# Patient Record
Sex: Female | Born: 1970 | Race: White | Hispanic: No | Smoking: Never smoker
Health system: Southern US, Community
[De-identification: ages and names within clinical notes are randomized; demographics above are authoritative.]

## PROBLEM LIST (undated history)

## (undated) DIAGNOSIS — F988 Other specified behavioral and emotional disorders with onset usually occurring in childhood and adolescence: Secondary | ICD-10-CM

## (undated) HISTORY — DX: Other specified behavioral and emotional disorders with onset usually occurring in childhood and adolescence: F98.8

## (undated) HISTORY — PX: WISDOM TOOTH EXTRACTION: SHX21

---

## 2011-07-24 ENCOUNTER — Ambulatory Visit (INDEPENDENT_AMBULATORY_CARE_PROVIDER_SITE_OTHER): Payer: BC Managed Care – PPO

## 2011-07-24 ENCOUNTER — Inpatient Hospital Stay (INDEPENDENT_AMBULATORY_CARE_PROVIDER_SITE_OTHER)
Admission: RE | Admit: 2011-07-24 | Discharge: 2011-07-24 | Disposition: A | Discharge status: Home / Self Care | Payer: BC Managed Care – PPO | Source: Ambulatory Visit | Attending: Emergency Medicine | Admitting: Emergency Medicine

## 2011-07-24 DIAGNOSIS — S0120XA Unspecified open wound of nose, initial encounter: Secondary | ICD-10-CM

## 2014-12-23 ENCOUNTER — Encounter (HOSPITAL_COMMUNITY): Payer: Self-pay | Admitting: *Deleted

## 2014-12-23 ENCOUNTER — Emergency Department (HOSPITAL_COMMUNITY)
Admission: EM | Admit: 2014-12-23 | Discharge: 2014-12-23 | Disposition: A | Discharge status: Home / Self Care | Payer: BC Managed Care – PPO | Source: Home / Self Care | Attending: Family Medicine | Admitting: Family Medicine

## 2014-12-23 DIAGNOSIS — R0789 Other chest pain: Secondary | ICD-10-CM

## 2014-12-23 NOTE — Discharge Instructions (Signed)
Chest Wall Pain Chest wall pain is pain in or around the bones and muscles of your chest. It may take up to 6 weeks to get better. It may take longer if you must stay physically active in your work and activities.  CAUSES  Chest wall pain may happen on its own. However, it may be caused by:  A viral illness like the flu.  Injury.  Coughing.  Exercise.  Arthritis.  Fibromyalgia.  Shingles. HOME CARE INSTRUCTIONS   Avoid overtiring physical activity. Try not to strain or perform activities that cause pain. This includes any activities using your chest or your abdominal and side muscles, especially if heavy weights are used.  Put ice on the sore area.  Put ice in a plastic bag.  Place a towel between your skin and the bag.  Leave the ice on for 15-20 minutes per hour while awake for the first 2 days.  Only take over-the-counter or prescription medicines for pain, discomfort, or fever as directed by your caregiver. SEEK IMMEDIATE MEDICAL CARE IF:   Your pain increases, or you are very uncomfortable.  You have a fever.  Your chest pain becomes worse.  You have new, unexplained symptoms.  You have nausea or vomiting.  You feel sweaty or lightheaded.  You have a cough with phlegm (sputum), or you cough up blood. MAKE SURE YOU:   Understand these instructions.  Will watch your condition.  Will get help right away if you are not doing well or get worse. Document Released: 10/18/2005 Document Revised: 01/10/2012 Document Reviewed: 06/14/2011 [facility] Patient Information 2015 [place], [place]. This information is not intended to replace advice given to you by your health care provider. Make sure you discuss any questions you have with your health care provider.  Musculoskeletal Pain Musculoskeletal pain is muscle and boney aches and pains. These pains can occur in any part of the body. Your caregiver may treat you without knowing the cause of the pain. They may treat you  if blood or urine tests, X-rays, and other tests were normal.  CAUSES There is often not a definite cause or reason for these pains. These pains may be caused by a type of germ (virus). The discomfort may also come from overuse. Overuse includes working out too hard when your body is not fit. Boney aches also come from weather changes. Bone is sensitive to atmospheric pressure changes. HOME CARE INSTRUCTIONS   Ask when your test results will be ready. Make sure you get your test results.  Only take over-the-counter or prescription medicines for pain, discomfort, or fever as directed by your caregiver. If you were given medications for your condition, do not drive, operate machinery or power tools, or sign legal documents for 24 hours. Do not drink alcohol. Do not take sleeping pills or other medications that may interfere with treatment.  Continue all activities unless the activities cause more pain. When the pain lessens, slowly resume normal activities. Gradually increase the intensity and duration of the activities or exercise.  During periods of severe pain, bed rest may be helpful. Lay or sit in any position that is comfortable.  Putting ice on the injured area.  Put ice in a bag.  Place a towel between your skin and the bag.  Leave the ice on for 15 to 20 minutes, 3 to 4 times a day.  Follow up with your caregiver for continued problems and no reason can be found for the pain. If the pain becomes worse  or does not go away, it may be necessary to repeat tests or do additional testing. Your caregiver may need to look further for a possible cause. SEEK IMMEDIATE MEDICAL CARE IF:  You have pain that is getting worse and is not relieved by medications.  You develop chest pain that is associated with shortness or breath, sweating, feeling sick to your stomach (nauseous), or throw up (vomit).  Your pain becomes localized to the abdomen.  You develop any new symptoms that seem different  or that concern you. MAKE SURE YOU:   Understand these instructions.  Will watch your condition.  Will get help right away if you are not doing well or get worse. Document Released: 10/18/2005 Document Revised: 01/10/2012 Document Reviewed: 06/22/2013 [facility] Patient Information 2015 [place], [place]. This information is not intended to replace advice given to you by your health care provider. Make sure you discuss any questions you have with your health care provider.  Chest Pain Observation It is often hard to give a specific diagnosis for the cause of chest pain. Among other possibilities your symptoms might be caused by inadequate oxygen delivery to your heart (angina). Angina that is not treated or evaluated can lead to a heart attack (myocardial infarction) or death. Blood tests, electrocardiograms, and X-rays may have been done to help determine a possible cause of your chest pain. After evaluation and observation, your health care provider has determined that it is unlikely your pain was caused by an unstable condition that requires hospitalization. However, a full evaluation of your pain may need to be completed, with additional diagnostic testing as directed. It is very important to keep your follow-up appointments. Not keeping your follow-up appointments could result in permanent heart damage, disability, or death. If there is any problem keeping your follow-up appointments, you must call your health care provider. HOME CARE INSTRUCTIONS  Due to the slight chance that your pain could be angina, it is important to follow your health care provider's treatment plan and also maintain a healthy lifestyle:  Maintain or work toward achieving a healthy weight.  Stay physically active and exercise regularly.  Decrease your salt intake.  Eat a balanced, healthy diet. Talk to a dietitian to learn about heart-healthy foods.  Increase your fiber intake by including whole grains, vegetables,  fruits, and nuts in your diet.  Avoid situations that cause stress, anger, or depression.  Take medicines as advised by your health care provider. Report any side effects to your health care provider. Do not stop medicines or adjust the dosages on your own.  Quit smoking. Do not use nicotine patches or gum until you check with your health care provider.  Keep your blood pressure, blood sugar, and cholesterol levels within normal limits.  Limit alcohol intake to no more than 1 drink per day for women who are not pregnant and 2 drinks per day for men.  Do not abuse drugs. SEEK IMMEDIATE MEDICAL CARE IF: You have severe chest pain or pressure which may include symptoms such as:  You feel pain or pressure in your arms, neck, jaw, or back.  You have severe back or abdominal pain, feel sick to your stomach (nauseous), or throw up (vomit).  You are sweating profusely.  You are having a fast or irregular heartbeat.  You feel short of breath while at rest.  You notice increasing shortness of breath during rest, sleep, or with activity.  You have chest pain that does not get better after rest or after  taking your usual medicine.  You wake from sleep with chest pain.  You are unable to sleep because you cannot breathe.  You develop a frequent cough or you are coughing up blood.  You feel dizzy, faint, or experience extreme fatigue.  You develop severe weakness, dizziness, fainting, or chills. Any of these symptoms may represent a serious problem that is an emergency. Do not wait to see if the symptoms will go away. Call your local emergency services (911 in the [place] Do not drive yourself to the hospital. MAKE SURE YOU:  Understand these instructions.  Will watch your condition.  Will get help right away if you are not doing well or get worse. Document Released: 11/20/2010 Document Revised: 10/23/2013 Document Reviewed: 04/19/2013 [facility] Patient Information 2015 [place],  [place]. This information is not intended to replace advice given to you by your health care provider. Make sure you discuss any questions you have with your health care provider.

## 2014-12-23 NOTE — ED Provider Notes (Signed)
CSN: [id number]     Arrival date & time 12/23/14  1337 History   First MD Initiated Contact with Patient 12/23/14 1411     Chief Complaint  Patient presents with  . 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 This pain was exacerbated by deep breathing and arm movements. She developed anxiety and hyperventilation associated with this pain. She had her vital signs checked at the school and she remembers that they were normal. She lied down for about an hour and the pain did not abate. She came to the urgent care department with a high level of anxiety and crying with the chest pain. Interestingly she is in excellent physical condition. She exercises regularly by running and lifting weights. She is thin considers himself to be in generally very good health. She has no medical history and takes no medications. No recent history of prolonged sitting or sedentary periods of time. After she had her EKG and had some time to calm down the patient stated that her pain had completely abated. She has no symptoms. She states "I feel great". She had physically worked out this morning and had no chest pain or other symptoms.   History reviewed. No pertinent past medical history. History reviewed. No pertinent past surgical history. No family history on file. History  Substance Use Topics  . Smoking status: Never Smoker   . Smokeless tobacco: Not on file  . Alcohol Use: Yes     Comment: occasional   OB History    No data available     Review of Systems  Constitutional: Negative for fever, activity change, appetite change and fatigue.  HENT: Negative.   Respiratory: Negative for cough.        As per history of present illness.   Cardiovascular:  Positive for chest pain. Negative for palpitations and leg swelling.  Gastrointestinal: Negative.   Genitourinary: Negative.   Musculoskeletal: Negative for neck pain and neck stiffness.  Skin: Negative.   Neurological: Negative.  Negative for tremors, seizures, syncope, speech difficulty, numbness and headaches.  Psychiatric/Behavioral: The patient is nervous/anxious.     Allergies  Review of patient's allergies indicates no known allergies.  Home Medications   Prior to Admission medications   Not on File   BP 137/80 mmHg  Pulse 67  Temp(Src) 98.3 F (36.8 C) (Oral)  Resp 16  SpO2 100%  LMP 11/20/2014 Physical Exam  Constitutional: She is oriented to person, place, and time. She appears well-developed and well-nourished. No distress.  HENT:  Head: Normocephalic and atraumatic.  Eyes: Conjunctivae and EOM are normal. Pupils are equal, round, and reactive to light.  Neck: Normal range of motion. Neck supple.  Cardiovascular: Normal rate, regular rhythm, normal heart sounds and intact distal pulses.  Exam reveals no gallop and no friction rub.   No murmur heard. Pulmonary/Chest: Effort normal and breath sounds normal. No respiratory distress. She has no wheezes. She has no rales. She exhibits no tenderness.  Unable to reproduce the chest pain with manual pressure. At the patient perform various arm movements and isometric exercises using arm and anterior chest muscles. None of these movements reproduce the pain.  Musculoskeletal: Normal range of motion. She exhibits no edema or tenderness.  Lymphadenopathy:    She has no cervical adenopathy.  Neurological: She  is alert and oriented to person, place, and time. She exhibits normal muscle tone.  Skin: Skin is warm and dry. She is not diaphoretic.  Psychiatric: She has a normal mood and affect.  Nursing note and vitals reviewed.   ED Course  Procedures (including critical care time) Labs Review Labs Reviewed - No data to  display  Imaging Review No results found. EKG: Normal sinus rhythm. No ectopy. Normal axis. Heart rate 57. No ST-T changes.  MDM   1. Left-sided chest wall pain    Ice locally Ease back into upper body workouts. Go to the ED for chest pain associated with heaviness, tightness, pressure, squeezing, shortness of breath, diaphoresis, weakness or other symptoms. Reassurance. The symptomatology is that of acute musculoskeletal chest wall pain.   Janne Napoleon, NP 12/23/14 1438

## 2014-12-23 NOTE — ED Notes (Signed)
Sudden onset today at 1230 sharp pain above left breast with difficulty taking a deep breath.  Pt did her usual  Exercise workout this morning.  She denies nausea or diaphoresis or indigestion.  Skin warm/dry color flushed in the face only

## 2016-09-09 ENCOUNTER — Ambulatory Visit (INDEPENDENT_AMBULATORY_CARE_PROVIDER_SITE_OTHER): Payer: BC Managed Care – PPO | Admitting: Neurology

## 2016-09-09 ENCOUNTER — Encounter: Payer: Self-pay | Admitting: Neurology

## 2016-09-09 ENCOUNTER — Other Ambulatory Visit (INDEPENDENT_AMBULATORY_CARE_PROVIDER_SITE_OTHER): Payer: BC Managed Care – PPO

## 2016-09-09 ENCOUNTER — Telehealth: Payer: Self-pay

## 2016-09-09 VITALS — BP 126/68 | HR 64 | Ht 65.5 in | Wt 141.0 lb

## 2016-09-09 DIAGNOSIS — R51 Headache: Secondary | ICD-10-CM | POA: Diagnosis not present

## 2016-09-09 DIAGNOSIS — R413 Other amnesia: Secondary | ICD-10-CM

## 2016-09-09 DIAGNOSIS — R519 Headache, unspecified: Secondary | ICD-10-CM

## 2016-09-09 LAB — VITAMIN B12: VITAMIN B 12: 781 pg/mL (ref 211–911)

## 2016-09-09 MED ORDER — TIZANIDINE HCL 2 MG PO TABS: ORAL_TABLET | ORAL | 0 refills | Status: DC

## 2016-09-09 MED ORDER — NORTRIPTYLINE HCL 25 MG PO CAPS: 25.0000 mg | ORAL_CAPSULE | Freq: Every day | ORAL | 3 refills | Status: DC

## 2016-09-09 NOTE — Telephone Encounter (Signed)
-----   Message from Pieter Partridge, DO sent at 09/09/2016 12:46 PM EST ----- b12 is normal

## 2016-09-09 NOTE — Progress Notes (Signed)
Chart forwarded.  

## 2016-09-09 NOTE — Progress Notes (Signed)
NEUROLOGY CONSULTATION NOTE  Terri Clarke MRN: [id number] DOB: 07-Dec-1970  Referring provider: Dr. Orland Mustard Primary care provider: Dr. Justin Mend  Reason for consult:  headache  HISTORY OF PRESENT ILLNESS: Terri Clarke is a 45 year old woman who presents for occipital headaches.  History obtained by patient and PCP note.  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  Onset:  06/26/16, when she went back to school.  No preceding event (head or neck injury). Location:  Small area in right suboccipital region which may radiate up to behind the right ear.  No neck pain. Quality:  constant Intensity:  Usually 2-3/10 but may fluctuate to 6/10 Aura:  no Prodrome:  no Associated symptoms:  No nausea, photophobia, phonophobia, osmophobia, visual disturbance or autonomic symptoms Duration:  Constant but fluctuations in intensity may last from 15 minutes to 1 hour Frequency:  Constant but fluctuations in intensity occur once a day Triggers/exacerbating factors:  no Relieving factors:  no Activity:  functions  Past NSAIDS:  ibuprofen Past analgesics:  no Past abortive triptans:  no Past muscle relaxants:  Cyclobenzaprine (ineffective) Past anti-emetic:  no Past antihypertensive medications:  no Past antidepressant medications:  no Past anticonvulsant medications:  no Past vitamins/Herbal/Supplements:  no Other past therapies:  no  Current NSAIDS:  no Current analgesics:  no Current triptans:  no Current anti-emetic:  no Current muscle relaxants:  no Current anti-anxiolytic:  no Current sleep aide:  no Current Antihypertensive medications:  no Current Antidepressant medications:  no Current Anticonvulsant medications:  no Current Vitamins/Herbal/Supplements:  no Current Antihistamines/Decongestants:  no Other therapy:  No  Around the same time, she reports memory problems.  She has forgotten about routine plays in basketball coaching.  She left the car engine on.   She denies disorientation when driving on familiar routes.  She denies problems with paying bills.  Her father had passed away from stroke and dementia.  Denies stress.  Denies prior history of headache  PAST MEDICAL HISTORY: No past medical history on file.  PAST SURGICAL HISTORY: No past surgical history on file.  MEDICATIONS: No current outpatient prescriptions on file prior to visit.   No current facility-administered medications on file prior to visit.     ALLERGIES: No Known Allergies  FAMILY HISTORY: Family History  Problem Relation Age of Onset  . Stroke Father     SOCIAL HISTORY: Social History   Social History  . Marital status: Single    Spouse name: N/A  . Number of children: N/A  . Years of education: N/A   Occupational History  . Not on file.   Social History Main Topics  . Smoking status: Never Smoker  . Smokeless tobacco: Not on file  . Alcohol use Yes     Comment: occasional  . Drug use: No  . Sexual activity: No   Other Topics Concern  . Not on file   Social History Narrative  . No narrative on file    REVIEW OF SYSTEMS: Constitutional: No fevers, chills, or sweats, no generalized fatigue, change in appetite Eyes: No visual changes, double vision, eye pain Ear, nose and throat: No hearing loss, ear pain, nasal congestion, sore throat Cardiovascular: No chest pain, palpitations Respiratory:  No shortness of breath at rest or with exertion, wheezes GastrointestinaI: No nausea, vomiting, diarrhea, abdominal pain, fecal incontinence Genitourinary:  No dysuria, urinary retention or frequency Musculoskeletal:  No neck pain, back pain Integumentary: No rash, pruritus, skin lesions Neurological: as above Psychiatric: No  depression, insomnia, anxiety Endocrine: No palpitations, fatigue, diaphoresis, mood swings, change in appetite, change in weight, increased thirst Hematologic/Lymphatic:  No purpura, petechiae. Allergic/Immunologic: no  itchy/runny eyes, nasal congestion, recent allergic reactions, rashes  PHYSICAL EXAM: Vitals:   09/09/16 0940  BP: 126/68  Pulse: 64   General: No acute distress.  Patient appears well-groomed.  Head:  Normocephalic/atraumatic.  Tenderness to palpation on right suboccipital region. Eyes:  fundi examined but not visualized Neck: supple, no paraspinal tenderness, full range of motion Back: No paraspinal tenderness Heart: regular rate and rhythm Lungs: Clear to auscultation bilaterally. Vascular: No carotid bruits. Neurological Exam: Mental status: alert and oriented to person, place, and time, recent and remote memory intact, fund of knowledge intact, attention and concentration intact, speech fluent and not dysarthric, language intact. Montreal Cognitive Assessment  09/09/2016  Visuospatial/ Executive (0/5) 5  Naming (0/3) 3  Attention: Read list of digits (0/2) 2  Attention: Read list of letters (0/1) 1  Attention: Serial 7 subtraction starting at 100 (0/3) 3  Language: Repeat phrase (0/2) 2  Language : Fluency (0/1) 0  Abstraction (0/2) 2  Delayed Recall (0/5) 4  Orientation (0/6) 6  Total 28  Adjusted Score (based on education) 28   Cranial nerves: CN I: not tested CN II: pupils equal, round and reactive to light, visual fields intact CN III, IV, VI:  full range of motion, no nystagmus, no ptosis CN V: facial sensation intact CN VII: upper and lower face symmetric CN VIII: hearing intact CN IX, X: gag intact, uvula midline CN XI: sternocleidomastoid and trapezius muscles intact CN XII: tongue midline Bulk & Tone: normal, no fasciculations. Motor:  5/5 throughout  Sensation: temperature and vibration sensation intact. Deep Tendon Reflexes:  2+ throughout, toes downgoing.  Finger to nose testing:  Without dysmetria.  Heel to shin:  Without dysmetria.  Gait:  Normal station and stride.  Able to turn and tandem walk. Romberg negative.  IMPRESSION: Cervicogenic or  nummular headache.   Memory deficits.  No cognitive impairment appreciated on exam  Her neurologic exam is normal and there is nothing in history that seems concerning.  She denies stress but her headache and memory issues may be stress related, especially in light of the fact that the started when school resumed.  PLAN: 1.  We will start nortriptyline 25mg  at bedtime for headache.  Titrate as needed or tolerated. 2.  Instead of cyclobenzaprine, we will try tizanidine 2 to 4mg .  Cautioned her about drowsiness and advised not to drive if she takes it. 3.  She will return in 3 months.  If not improved or symptoms worsen, would consider imaging (MRI of brain and/or cervical spine) 4.  Even though no cognitive deficits appreciated on exam, will check B12.  Thank you for allowing me to take part in the care of this patient.  Metta Clines, DO  CC:  London Pepper, MD  Maurice Small, MD

## 2016-09-09 NOTE — Patient Instructions (Addendum)
1.  Start nortriptyline 25mg  at bedtime.  Contact me in 4 weeks with update and we can increase dose if needed 2.  For increased headache, try tizanidine (another muscle relaxant).  Take 1 to 2 tablets every 6 hours as needed.  Do not drive while on it due to drowsiness 3.  Follow up in 3 months.  If headache worse, contact me sooner. 4.  Check B12 level for memory

## 2016-09-10 NOTE — Telephone Encounter (Signed)
Message relayed to patient. Verbalized understanding and denied questions.   

## 2016-09-27 ENCOUNTER — Other Ambulatory Visit (HOSPITAL_COMMUNITY)
Admission: RE | Admit: 2016-09-27 | Discharge: 2016-09-27 | Disposition: A | Discharge status: Home / Self Care | Payer: BC Managed Care – PPO | Source: Ambulatory Visit | Attending: Family Medicine | Admitting: Family Medicine

## 2016-09-27 ENCOUNTER — Other Ambulatory Visit: Payer: Self-pay | Admitting: Family Medicine

## 2016-09-27 DIAGNOSIS — Z01419 Encounter for gynecological examination (general) (routine) without abnormal findings: Secondary | ICD-10-CM | POA: Insufficient documentation

## 2016-09-27 DIAGNOSIS — Z1151 Encounter for screening for human papillomavirus (HPV): Secondary | ICD-10-CM | POA: Insufficient documentation

## 2016-09-29 LAB — CYTOLOGY - PAP
DIAGNOSIS: NEGATIVE
HPV: NOT DETECTED

## 2016-09-29 MED ORDER — FENTANYL CITRATE (PF) 100 MCG/2ML IJ SOLN
INTRAMUSCULAR | Status: AC
  Filled 2016-09-29: qty 4

## 2016-09-29 MED ORDER — MIDAZOLAM HCL 2 MG/2ML IJ SOLN
INTRAMUSCULAR | Status: AC
  Filled 2016-09-29: qty 2

## 2016-09-29 MED ORDER — PROPOFOL 10 MG/ML IV BOLUS
INTRAVENOUS | Status: AC
  Filled 2016-09-29: qty 20

## 2016-11-04 ENCOUNTER — Ambulatory Visit: Payer: BC Managed Care – PPO | Admitting: Neurology

## 2016-12-10 ENCOUNTER — Ambulatory Visit: Payer: BC Managed Care – PPO | Admitting: Neurology

## 2018-07-11 DIAGNOSIS — Z8619 Personal history of other infectious and parasitic diseases: Secondary | ICD-10-CM | POA: Insufficient documentation

## 2018-08-07 ENCOUNTER — Telehealth: Payer: Self-pay | Admitting: Family Medicine

## 2018-08-07 NOTE — Telephone Encounter (Signed)
Called made app with patient

## 2018-08-07 NOTE — Telephone Encounter (Signed)
Please advise on scheduling new patient appointment.   Copied from [place]. Topic: Appointment Scheduling - Scheduling Inquiry for Clinic >> Aug 07, 2018 12:47 PM Margot Ables wrote: Reason for CRM: pt called to ask for appt with Dr. Juleen China on Wednesday 08/09/18 as she is off work. She states that pts twin sister saw Dr. Juleen China this morning and discussed her coming in and was advised to send msg for approval for appt. Sister is Mickle Plumb. Please advise.

## 2018-08-08 NOTE — Progress Notes (Signed)
Subjective:    Terri Clarke is a 47 y.o. female and is here for a comprehensive physical exam.  Health Maintenance Due  Topic Date Due  . HIV Screening  02[birth year]19[birth year]1984   Current Outpatient Medications:  .  None  PMHx, SurgHx, SocialHx, Medications, and Allergies were reviewed in the Visit Navigator and updated as appropriate.   No past medical history on file.  No past surgical history on file.   Family History  Problem Relation Age of Onset  . Stroke Father     Social History   Tobacco Use  . Smoking status: Never Smoker  . Smokeless tobacco: Never Used  Substance Use Topics  . Alcohol use: Yes    Comment: occasional  . Drug use: No    Review of Systems:   Pertinent items are noted in the HPI. Otherwise, ROS is negative.  Objective:   BP 124/62   Pulse (!) 48   Temp 98.6 F (37 C) (Oral)   Ht 5' 5.5" (1.664 m)   Wt 145 lb 12.8 oz (66.1 kg)   LMP 07/16/2018   SpO2 100%   BMI 23.89 kg/m   General appearance: alert, cooperative and appears stated age. Head: normocephalic, without obvious abnormality, atraumatic. Neck: no adenopathy, supple, symmetrical, trachea midline; thyroid not enlarged, symmetric, no tenderness/mass/nodules. Lungs: clear to auscultation bilaterally. Heart: regular rate and rhythm Abdomen: soft, non-tender; no masses,  no organomegaly. Extremities: extremities normal, atraumatic, no cyanosis or edema. Skin: skin color, texture, turgor normal, no rashes or lesions. Lymph: cervical, supraclavicular, and axillary nodes normal; no abnormal inguinal nodes palpated. Neurologic: grossly normal.                                      Assessment/Plan:   Terri Clarke was seen today for establish care.  Diagnoses and all orders for this visit:  Routine physical examination  Other fatigue -     CBC with Differential/Platelet -     Comprehensive metabolic panel -     Lipid panel -     TSH  Herpes labialis without complication -      valACYclovir (VALTREX) 1000 MG tablet; Take 1 tablet (1,000 mg total) by mouth 2 (two) times daily for 1 day.  Irregular menses Comments: Patient would like to take OCPs for hormone replacement. Low risk. Red flags reviewed.  Orders: -     norgestimate-ethinyl estradiol ([place] 28) 0.25-35 MG-MCG tablet; Take 1 tablet by mouth daily.    Patient Counseling: [x]    Nutrition: Stressed importance of moderation in sodium/caffeine intake, saturated fat and cholesterol, caloric balance, sufficient intake of fresh fruits, vegetables, fiber, calcium, iron, and 1 mg of folate supplement per day (for females capable of pregnancy).  [x]    Stressed the importance of regular exercise.   [x]    Substance Abuse: Discussed cessation/primary prevention of tobacco, alcohol, or other drug use; driving or other dangerous activities under the influence; availability of treatment for abuse.   [x]    Injury prevention: Discussed safety belts, safety helmets, smoke detector, smoking near bedding or upholstery.   [x]    Sexuality: Discussed sexually transmitted diseases, partner selection, use of condoms, avoidance of unintended pregnancy  and contraceptive alternatives.  [x]    Dental health: Discussed importance of regular tooth brushing, flossing, and dental visits.  [x]    Health maintenance and immunizations reviewed. Please refer to Health maintenance section.   Briscoe Deutscher, DO [place]  [place]

## 2018-08-09 ENCOUNTER — Ambulatory Visit: Payer: BC Managed Care – PPO | Admitting: Family Medicine

## 2018-08-09 ENCOUNTER — Encounter: Payer: Self-pay | Admitting: Family Medicine

## 2018-08-09 VITALS — BP 124/62 | HR 48 | Temp 98.6°F | Ht 65.5 in | Wt 145.8 lb

## 2018-08-09 DIAGNOSIS — R5383 Other fatigue: Secondary | ICD-10-CM | POA: Diagnosis not present

## 2018-08-09 DIAGNOSIS — Z0000 Encounter for general adult medical examination without abnormal findings: Secondary | ICD-10-CM | POA: Diagnosis not present

## 2018-08-09 DIAGNOSIS — B001 Herpesviral vesicular dermatitis: Secondary | ICD-10-CM | POA: Diagnosis not present

## 2018-08-09 DIAGNOSIS — N926 Irregular menstruation, unspecified: Secondary | ICD-10-CM

## 2018-08-09 LAB — LIPID PANEL
Cholesterol: 182 mg/dL (ref 0–200)
HDL: 88.3 mg/dL (ref >39.00)
LDL Cholesterol: 83 mg/dL (ref 0–99)
NonHDL: 94.02
Total CHOL/HDL Ratio: 2
Triglycerides: 54 mg/dL (ref 0.0–149.0)
VLDL: 10.8 mg/dL (ref 0.0–40.0)

## 2018-08-09 LAB — COMPREHENSIVE METABOLIC PANEL
ALT: 11 U/L (ref 0–35)
AST: 14 U/L (ref 0–37)
Albumin: 4.7 g/dL (ref 3.5–5.2)
Alkaline Phosphatase: 40 U/L (ref 39–117)
BUN: 17 mg/dL (ref 6–23)
CO2: 28 mEq/L (ref 19–32)
Calcium: 10.1 mg/dL (ref 8.4–10.5)
Chloride: 102 mEq/L (ref 96–112)
Creatinine, Ser: 1 mg/dL (ref 0.40–1.20)
GFR: 62.51 mL/min (ref >60.00)
Glucose, Bld: 102 mg/dL — ABNORMAL HIGH (ref 70–99)
Potassium: 4.1 mEq/L (ref 3.5–5.1)
Sodium: 137 mEq/L (ref 135–145)
Total Bilirubin: 0.6 mg/dL (ref 0.2–1.2)
Total Protein: 7.7 g/dL (ref 6.0–8.3)

## 2018-08-09 LAB — CBC WITH DIFFERENTIAL/PLATELET
Basophils Absolute: 0 10*3/uL (ref 0.0–0.1)
Basophils Relative: 0.5 % (ref 0.0–3.0)
Eosinophils Absolute: 0.1 10*3/uL (ref 0.0–0.7)
Eosinophils Relative: 2.1 % (ref 0.0–5.0)
HCT: 42.2 % (ref 36.0–46.0)
Hemoglobin: 14.4 g/dL (ref 12.0–15.0)
Lymphocytes Relative: 27.6 % (ref 12.0–46.0)
Lymphs Abs: 1.8 10*3/uL (ref 0.7–4.0)
MCHC: 34.2 g/dL (ref 30.0–36.0)
MCV: 89.9 fl (ref 78.0–100.0)
Monocytes Absolute: 0.5 10*3/uL (ref 0.1–1.0)
Monocytes Relative: 8.5 % (ref 3.0–12.0)
Neutro Abs: 4 10*3/uL (ref 1.4–7.7)
Neutrophils Relative %: 61.3 % (ref 43.0–77.0)
Platelets: 262 10*3/uL (ref 150.0–400.0)
RBC: 4.7 Mil/uL (ref 3.87–5.11)
RDW: 12.6 % (ref 11.5–15.5)
WBC: 6.5 10*3/uL (ref 4.0–10.5)

## 2018-08-09 LAB — TSH: TSH: 2.11 u[IU]/mL (ref 0.35–4.50)

## 2018-08-09 MED ORDER — VALACYCLOVIR HCL 1 G PO TABS: 1000.0000 mg | ORAL_TABLET | Freq: Two times a day (BID) | ORAL | 0 refills | Status: AC

## 2018-08-09 MED ORDER — NORGESTIMATE-ETH ESTRADIOL 0.25-35 MG-MCG PO TABS: 1.0000 | ORAL_TABLET | Freq: Every day | ORAL | 12 refills | Status: DC

## 2018-08-13 ENCOUNTER — Encounter: Payer: Self-pay | Admitting: Family Medicine

## 2019-01-29 ENCOUNTER — Encounter: Payer: Self-pay | Admitting: Family Medicine

## 2019-01-30 ENCOUNTER — Other Ambulatory Visit: Payer: Self-pay

## 2019-01-30 ENCOUNTER — Encounter: Payer: Self-pay | Admitting: Family Medicine

## 2019-01-30 ENCOUNTER — Ambulatory Visit (INDEPENDENT_AMBULATORY_CARE_PROVIDER_SITE_OTHER): Payer: BC Managed Care – PPO | Admitting: Family Medicine

## 2019-01-30 ENCOUNTER — Ambulatory Visit: Payer: BC Managed Care – PPO | Admitting: Family Medicine

## 2019-01-30 VITALS — Ht 65.5 in | Wt 145.0 lb

## 2019-01-30 DIAGNOSIS — F902 Attention-deficit hyperactivity disorder, combined type: Secondary | ICD-10-CM | POA: Diagnosis not present

## 2019-01-30 NOTE — Progress Notes (Signed)
Virtual Visit via Video   I connected with Terri Clarke on 01/31/19 at  2:40 PM EDT by a video enabled telemedicine application and verified that I am speaking with the correct person using two identifiers. Location patient: Home Location provider:  [facility], Office Persons participating in the virtual visit: Terri Clarke, Briscoe Deutscher, DO [name], CMA acting as scribe for Dr. Briscoe Deutscher.   I discussed the limitations of evaluation and management by telemedicine and the availability of in person appointments. The patient expressed understanding and agreed to proceed.  Subjective:   VWP:VXYIAXK has been staying at home. 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   She has had some increased problems with attention. She has does not have current diagnosis of ADHD. She struggles most with focusing on one task at a time. She finds herself mentally wondering off topic.she does have trouble with staying still and winding down. She feels like it was issue all her life but she has noticed that symptoms have increased after she stopped playing sports. She declined referral for testing.   We have reviewed all risk and benefits with any ADHD medications. Patient was instructed that we will need to do a short acting first and if it does not work then we can go on to an extended release.   Reviewed all precautions and expectations with prevention of Covid-19  ROS: See pertinent positives and negatives per HPI.  Patient Active Problem List   Diagnosis Date Noted  . Herpes labialis without complication 55/37/4827    Social History   Tobacco Use  . Smoking status: Never Smoker  . Smokeless tobacco: Never Used  Substance Use Topics  . Alcohol use: Yes    Comment: occasional   Current Outpatient Medications:  .  norgestimate-ethinyl estradiol (SPRINTEC 28) 0.25-35 MG-MCG tablet, Take 1 tablet by mouth daily., Disp: 1 Package, Rfl:  12  No Known Allergies  Objective:   VITALS: Per patient if applicable, see vitals. GENERAL: Alert, appears well and in no acute distress. HEENT: Atraumatic, conjunctiva clear, no obvious abnormalities on inspection of external nose and ears. NECK: Normal movements of the head and neck. CARDIOPULMONARY: No increased WOB. Speaking in clear sentences. I:E ratio WNL.  MS: Moves all visible extremities without noticeable abnormality. PSYCH: Pleasant and cooperative, well-groomed. Speech normal rate and rhythm. Affect is appropriate. Insight and judgement are appropriate. Attention is focused, linear, and appropriate.  NEURO: CN grossly intact. Oriented as arrived to appointment on time with no prompting. Moves both UE equally.  SKIN: No obvious lesions, wounds, erythema, or cyanosis noted on face or hands.  Assessment and Plan:   [name] was seen today for difficulty concentrating.  Diagnoses and all orders for this visit:  Attention deficit hyperactivity disorder (ADHD), combined type Comments: The benefits of stimulant medication treatment appear to outweigh the current risks. We discussed risks and benefits of different medications. I recommended and discussed appropriate dietary modifications and routine exercise. Stimulant medication management, careful titration, and dose optimization of stimulant medication was discussed as the treatment option with the best scientific evidence helping reduce ADHD symptoms. There is no cure for ADHD. Stimulant medication side effects: The temporary side effects of stimulants including decreased appetite, sleep disturbance, mood changes, personality changes, ticks, increases in heart rate and blood pressure, abdominal pain, nausea, vomiting, and dry mouth were discussed.  Orders: -     amphetamine-dextroamphetamine (ADDERALL) 10 MG tablet; Take 1  tablet (10 mg total) by mouth 2 (two) times daily.    Adult ADHD Self Report Scale (most recent)    Adult  ADHD Self-Report Scale (ASRS-v1.1) Symptom Checklist - 01/30/19 1436      Part A   1. How often do you have trouble wrapping up the final details of a project, once the challenging parts have been done?  (!) Often  2. How often do you have difficulty getting things done in order when you have to do a task that requires organization?  (!) Sometimes    3. How often do you have problems remembering appointments or obligations?  Rarely  4. When you have a task that requires a lot of thought, how often do you avoid or delay getting started?  Rarely    5. How often do you fidget or squirm with your hands or feet when you have to sit down for a long time?  (!) Often  6. How often do you feel overly active and compelled to do things, like you were driven by a motor?  (!) Often      Part B   7. How often do you make careless mistakes when you have to work on a boring or difficult project?  Rarely  8. How often do you have difficulty keeping your attention when you are doing boring or repetitive work?  (!) Often    9. How often do you have difficulty concentrating on what people say to you, even when they are speaking to you directly?  (!) Sometimes  10. How often do you misplace or have difficulty finding things at home or at work?  Sometimes    11. How often are you distracted by activity or noise around you?  Sometimes  12. How often do you leave your seat in meetings or other situations in which you are expected to remain seated?  (!) Sometimes    13. How often do you feel restless or fidgety?  (!) Often  14. How often do you have difficulty unwinding and relaxing when you have time to yourself?  (!) Often    15. How often do you find yourself talking too much when you are in social situations?  Sometimes  16. When you are in a conversation, how often do you find yourself finishing the sentences of the people you are talking to, before they can finish them themselves?  (!) Often    17. How often do you have  difficulty waiting your turn in situations when turn taking is required?  (!) Often  18. How often do you interrupt others when they are busy?  (!) Sometimes       . Reviewed expectations re: course of current medical issues. . Discussed self-management of symptoms. . Outlined signs and symptoms indicating need for more acute intervention. . Patient verbalized understanding and all questions were answered. Marland Kitchen Health Maintenance issues including appropriate healthy diet, exercise, and smoking avoidance were discussed with patient. . See orders for this visit as documented in the electronic medical record.  Briscoe Deutscher, DO 01/31/2019

## 2019-01-31 ENCOUNTER — Encounter: Payer: Self-pay | Admitting: Family Medicine

## 2019-01-31 ENCOUNTER — Telehealth: Payer: Self-pay

## 2019-01-31 DIAGNOSIS — F909 Attention-deficit hyperactivity disorder, unspecified type: Secondary | ICD-10-CM | POA: Insufficient documentation

## 2019-01-31 MED ORDER — AMPHETAMINE-DEXTROAMPHETAMINE 10 MG PO TABS: 10.0000 mg | ORAL_TABLET | Freq: Two times a day (BID) | ORAL | 0 refills | Status: DC

## 2019-01-31 NOTE — Telephone Encounter (Signed)
Lubertha Basque Key: [id number] - Rx #: [id number]  Need help? Call us at [phone]    Status  Sent to [employer]   Drug Amphetamine-Dextroamphetamine 10MG  tablets Form [employer] Electronic PA Form (NCPDP)   Original Claim Info 75 QTY LIMIT EXCD PA RQD [phone] REQUIRES PRIOR AUTHORIZATION

## 2019-02-05 ENCOUNTER — Telehealth: Payer: Self-pay

## 2019-02-05 NOTE — Telephone Encounter (Signed)
Lubertha Basque Key: [id number] - PA Case ID: [id number] - Rx #: [id number]  Need help? Call us at [phone]    Status  Sent to [employer]   Drug Amphetamine-Dextroamphetamine 10MG  tablets Form [employer] Electronic PA Form (NCPDP)   Original Claim Info 75 QTY LIMIT EXCD PA RQD [phone] REQUIRES PRIOR AUTHORIZATION

## 2019-03-02 ENCOUNTER — Encounter: Payer: Self-pay | Admitting: Family Medicine

## 2019-03-02 NOTE — Telephone Encounter (Signed)
Patient was just seen on 3/31 do you want her to be seen virtually?

## 2019-03-06 ENCOUNTER — Other Ambulatory Visit: Payer: Self-pay

## 2019-03-06 ENCOUNTER — Encounter: Payer: Self-pay | Admitting: Family Medicine

## 2019-03-06 ENCOUNTER — Ambulatory Visit (INDEPENDENT_AMBULATORY_CARE_PROVIDER_SITE_OTHER): Payer: BC Managed Care – PPO | Admitting: Family Medicine

## 2019-03-06 DIAGNOSIS — F900 Attention-deficit hyperactivity disorder, predominantly inattentive type: Secondary | ICD-10-CM

## 2019-03-06 MED ORDER — AMPHETAMINE-DEXTROAMPHET ER 20 MG PO CP24: 20.0000 mg | ORAL_CAPSULE | ORAL | 0 refills | Status: DC

## 2019-03-06 NOTE — Patient Instructions (Signed)
The app that you need to look up is Good [employer]

## 2019-03-06 NOTE — Progress Notes (Signed)
Virtual Visit via Video   Due to the COVID-19 pandemic, this visit was completed with telemedicine (audio/video) technology to reduce patient and provider exposure as well as to preserve personal protective equipment.   I connected with Lubertha Basque by a video enabled telemedicine application and verified that I am speaking with the correct person using two identifiers. Location patient: Home Location provider: [facility], [facility] Persons participating in the virtual visit: Ayanni Tun, Briscoe Deutscher, DO   I discussed the limitations of evaluation and management by telemedicine and the availability of in person appointments. The patient expressed understanding and agreed to proceed.  Care Team   Patient Care Team: Briscoe Deutscher, DO as PCP - General (Family Medicine)  Subjective:   HPI: Since the last visit has the patient had any:  Appetite changes? No Unintentional weight loss? No Is medication working well ? Yes, but crashing mid-day and would like better focus-control. Does patient take drug holidays? No Difficulties falling to sleep or maintaining sleep? No Any anxiety?  No Any cardiac issues (fainting or paliptations)? No Suicidal thoughts? No Changes in health since last visit? No New medications? No Any illicit substance abuse? No Has the patient taken his medication today? Yes  Review of Systems  Constitutional: Negative for chills, fever, malaise/fatigue and weight loss.  Respiratory: Negative for cough, shortness of breath and wheezing.   Cardiovascular: Negative for chest pain, palpitations and leg swelling.  Gastrointestinal: Negative for abdominal pain, constipation, diarrhea, nausea and vomiting.  Genitourinary: Negative for dysuria and urgency.  Musculoskeletal: Negative for joint pain and myalgias.  Skin: Negative for rash.  Neurological: Negative for dizziness and headaches.  Psychiatric/Behavioral: Negative for depression, substance abuse and  suicidal ideas. The patient is not nervous/anxious.     Patient Active Problem List   Diagnosis Date Noted  . ADHD (attention deficit hyperactivity disorder) 01/31/2019  . History of cold sores 07/11/2018    Social History   Tobacco Use  . Smoking status: Never Smoker  . Smokeless tobacco: Never Used  Substance Use Topics  . Alcohol use: Yes    Comment: occasional   Current Outpatient Medications:  .  amphetamine-dextroamphetamine (ADDERALL) 10 MG tablet, Take 1 tablet (10 mg total) by mouth 2 (two) times daily., Disp: 60 tablet, Rfl: 0 .  norgestimate-ethinyl estradiol (SPRINTEC 28) 0.25-35 MG-MCG tablet, Take 1 tablet by mouth daily., Disp: 1 Package, Rfl: 12  No Known Allergies  Objective:   VITALS: Per patient if applicable, see vitals. GENERAL: Alert, appears well and in no acute distress. HEENT: Atraumatic, conjunctiva clear, no obvious abnormalities on inspection of external nose and ears. NECK: Normal movements of the head and neck. CARDIOPULMONARY: No increased WOB. Speaking in clear sentences. I:E ratio WNL.  MS: Moves all visible extremities without noticeable abnormality. PSYCH: Pleasant and cooperative, well-groomed. Speech normal rate and rhythm. Affect is appropriate. Insight and judgement are appropriate. Attention is focused, linear, and appropriate.  NEURO: CN grossly intact. Oriented as arrived to appointment on time with no prompting. Moves both UE equally.  SKIN: No obvious lesions, wounds, erythema, or cyanosis noted on face or hands.  Assessment and Plan:   Iyanla was seen today for medication refill.  Diagnoses and all orders for this visit:  Attention deficit hyperactivity disorder (ADHD), predominantly inattentive type -     amphetamine-dextroamphetamine (ADDERALL XR) 20 MG 24 hr capsule; Take 1 capsule (20 mg total) by mouth every morning.   Marland Kitchen COVID-19 Education: The signs and symptoms of COVID-19 were  discussed with the patient and how to seek  care for testing if needed. The importance of social distancing was discussed today. . Reviewed expectations re: course of current medical issues. . Discussed self-management of symptoms. . Outlined signs and symptoms indicating need for more acute intervention. . Patient verbalized understanding and all questions were answered. Marland Kitchen Health Maintenance issues including appropriate healthy diet, exercise, and smoking avoidance were discussed with patient. . See orders for this visit as documented in the electronic medical record.  Briscoe Deutscher, DO  Records requested if needed. Time spent: 25 minutes, of which >50% was spent in obtaining information about her symptoms, reviewing her previous labs, evaluations, and treatments, counseling her about her condition (please see the discussed topics above), and developing a plan to further investigate it; she had a number of questions which I addressed.

## 2019-03-07 ENCOUNTER — Telehealth: Payer: Self-pay

## 2019-03-07 NOTE — Telephone Encounter (Signed)
PA initiated via [email or web address]

## 2019-03-07 NOTE — Telephone Encounter (Signed)
Adderall XR 20 mg cap Qty 30/30 days  BIN [id number] PCN ADV GRPRx [id number]

## 2019-03-07 NOTE — Telephone Encounter (Signed)
Lubertha Basque (Key: [id number])   Your demographic data has been sent to the plan successfully. They will respond with your clinical questions and you will be notified by email when available within the next business day. You can also check for an update later by opening this request from your dashboard. Please do not fax or call the plan to resubmit this request. If you need assistance, please chat with CoverMyMeds or call us at [phone].

## 2019-03-09 NOTE — Telephone Encounter (Signed)
Terri Clarke (Key: [id number])   This request has received a Favorable outcome. Please note any additional information provided by [employer] at the bottom of this request.

## 2019-04-11 ENCOUNTER — Other Ambulatory Visit: Payer: Self-pay | Admitting: Family Medicine

## 2019-04-11 DIAGNOSIS — F900 Attention-deficit hyperactivity disorder, predominantly inattentive type: Secondary | ICD-10-CM

## 2019-04-12 MED ORDER — AMPHETAMINE-DEXTROAMPHET ER 20 MG PO CP24: 20.0000 mg | ORAL_CAPSULE | ORAL | 0 refills | Status: DC

## 2019-04-12 NOTE — Telephone Encounter (Signed)
Rx Request 

## 2019-04-16 ENCOUNTER — Telehealth: Payer: Self-pay | Admitting: Family Medicine

## 2019-04-16 NOTE — Telephone Encounter (Signed)
Copied from [place] [phone]. Topic: Quick Communication - See Telephone Encounter >> Apr 16, 2019  4:29 PM Blase Mess A wrote: CRM for notification. See Telephone encounter for: 04/16/19.  Patient is calling back to speak to [facility] regarding her script that was sent to [facility] in [place]. Thank you.CB- [phone]

## 2019-04-16 NOTE — Telephone Encounter (Signed)
Pt called and pharmacy reports that they do not have Rx. Please advise.

## 2019-04-17 ENCOUNTER — Other Ambulatory Visit: Payer: Self-pay | Admitting: Family Medicine

## 2019-04-17 ENCOUNTER — Telehealth: Payer: Self-pay | Admitting: Family Medicine

## 2019-04-17 DIAGNOSIS — F900 Attention-deficit hyperactivity disorder, predominantly inattentive type: Secondary | ICD-10-CM

## 2019-04-17 MED ORDER — AMPHETAMINE-DEXTROAMPHET ER 20 MG PO CP24: 20.0000 mg | ORAL_CAPSULE | ORAL | 0 refills | Status: DC

## 2019-04-17 NOTE — Telephone Encounter (Signed)
Error

## 2019-04-17 NOTE — Telephone Encounter (Signed)
I called patient back and she informed me that Terri Clarke did not receive the medication, (Adderall) .  I tried to call the [facility] pharmacy but have been placed on hold several times.  I informed patient that I will continue to reach out to them and also send this message to Dr. Juleen China to perhaps resend.

## 2019-04-17 NOTE — Telephone Encounter (Signed)
   New script pulled up.

## 2019-04-17 NOTE — Telephone Encounter (Signed)
F.Y.I  I spoke with patient and she informed me that the [facility] that her med was sent to on 04/12/2019, told her that they did not have med on file.  I have been trying to contact [facility] pharmacy this morning several times with no answer.  Patient is requesting for medication to be sent to  [facility] in [place].  Please advise.  Last fill 04/12/19  #30/0     Last Office Visit 03/06/19.

## 2019-04-17 NOTE — Telephone Encounter (Signed)
See note

## 2019-04-17 NOTE — Telephone Encounter (Signed)
Check database to make sure that it was not filled. Tee up Rx for new pharmacy.

## 2019-04-17 NOTE — Addendum Note (Signed)
Addended by: Francella Solian on: 04/17/2019 02:49 PM   Modules accepted: Orders

## 2019-04-17 NOTE — Telephone Encounter (Signed)
Pt called in reference to needing amphetamine-dextroamphetamine (ADDERALL XR) 20 MG 24 hr capsule sent to a dif pharmacy. Pt stated she would like it to go to [facility], [address] AT [facility] OF [address] [phone] (Phone) [phone] (Fax)    Please advise.

## 2019-04-17 NOTE — Telephone Encounter (Signed)
JoEllen- Not sure why this CRM is asking for Hailey to call back r.e. 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 Thanks, [facility]  Copied from [employer] [phone]. Topic: Quick Communication - See Telephone Encounter >> Apr 16, 2019  4:29 PM Blase Mess A wrote: CRM for notification. See Telephone encounter for: 04/16/19.  Patient is calling back to speak to [facility] regarding her script that was sent to [facility] in [place]. Thank you.CB- [phone]

## 2019-04-19 ENCOUNTER — Other Ambulatory Visit: Payer: Self-pay

## 2019-04-19 ENCOUNTER — Other Ambulatory Visit: Payer: Self-pay | Admitting: Physical Therapy

## 2019-04-19 DIAGNOSIS — F900 Attention-deficit hyperactivity disorder, predominantly inattentive type: Secondary | ICD-10-CM

## 2019-04-19 MED ORDER — AMPHETAMINE-DEXTROAMPHET ER 20 MG PO CP24: 20.0000 mg | ORAL_CAPSULE | ORAL | 0 refills | Status: DC

## 2019-04-19 NOTE — Telephone Encounter (Signed)
PEC called with Terri Clarke stating that she has been trying to get her Adderall refilled since 04/11/19 going back and forth between our office and pharmacy.  Please return pt's call ASAP.

## 2019-04-19 NOTE — Addendum Note (Signed)
Addended by: Francella Solian on: 04/19/2019 10:39 AM   Modules accepted: Orders

## 2019-04-19 NOTE — Telephone Encounter (Signed)
Patient called very frustrated because she went to the Pharmacy and her Rx was not there. Per patient she was told that the Rx was not received for amphetamine-dextroamphetamine (ADDERALL XR) 20 MG 24 hr capsule she also stated that the pharmacy will only accept the hard copy or an electronic fax in order to fill this prescription. Please call patient when ready Ph# [phone]

## 2019-05-17 ENCOUNTER — Other Ambulatory Visit: Payer: Self-pay | Admitting: Family Medicine

## 2019-05-17 DIAGNOSIS — F900 Attention-deficit hyperactivity disorder, predominantly inattentive type: Secondary | ICD-10-CM

## 2019-05-17 MED ORDER — AMPHETAMINE-DEXTROAMPHET ER 20 MG PO CP24: 20.0000 mg | ORAL_CAPSULE | ORAL | 0 refills | Status: DC

## 2019-05-17 NOTE — Telephone Encounter (Signed)
Pt requesting refill on Adderall XR 20 mg, Last OV 03/06/2019, Last Rx 04/19/2019, # 30, refill 0.

## 2019-06-18 ENCOUNTER — Other Ambulatory Visit: Payer: Self-pay | Admitting: Family Medicine

## 2019-06-18 DIAGNOSIS — F900 Attention-deficit hyperactivity disorder, predominantly inattentive type: Secondary | ICD-10-CM

## 2019-06-20 NOTE — Telephone Encounter (Signed)
   Last script 05/17/2019 Last o/v 03/06/2019 No f/u

## 2019-06-21 MED ORDER — AMPHETAMINE-DEXTROAMPHET ER 20 MG PO CP24: 20.0000 mg | ORAL_CAPSULE | ORAL | 0 refills | Status: DC

## 2019-07-18 ENCOUNTER — Other Ambulatory Visit: Payer: Self-pay | Admitting: Family Medicine

## 2019-07-18 DIAGNOSIS — F900 Attention-deficit hyperactivity disorder, predominantly inattentive type: Secondary | ICD-10-CM

## 2019-07-19 NOTE — Telephone Encounter (Signed)
Last OV 03/06/19 Last refill 06/21/19 #30/0 Next OV not scheduled  Forwarding to Dr. Juleen China

## 2019-07-21 MED ORDER — AMPHETAMINE-DEXTROAMPHET ER 20 MG PO CP24: 20.0000 mg | ORAL_CAPSULE | ORAL | 0 refills | Status: DC

## 2019-07-27 ENCOUNTER — Other Ambulatory Visit: Payer: Self-pay | Admitting: Family Medicine

## 2019-07-27 DIAGNOSIS — N926 Irregular menstruation, unspecified: Secondary | ICD-10-CM

## 2019-07-27 NOTE — Telephone Encounter (Signed)
Last fill 08/09/18  #1 pack/12 Last OV 03/06/19

## 2019-08-21 ENCOUNTER — Other Ambulatory Visit: Payer: Self-pay | Admitting: Family Medicine

## 2019-08-21 DIAGNOSIS — F900 Attention-deficit hyperactivity disorder, predominantly inattentive type: Secondary | ICD-10-CM

## 2019-08-22 MED ORDER — AMPHETAMINE-DEXTROAMPHET ER 20 MG PO CP24: 20.0000 mg | ORAL_CAPSULE | ORAL | 0 refills | Status: DC

## 2019-09-20 ENCOUNTER — Other Ambulatory Visit: Payer: Self-pay | Admitting: Family Medicine

## 2019-09-20 ENCOUNTER — Encounter (INDEPENDENT_AMBULATORY_CARE_PROVIDER_SITE_OTHER): Payer: Self-pay | Admitting: Family Medicine

## 2019-09-20 DIAGNOSIS — F900 Attention-deficit hyperactivity disorder, predominantly inattentive type: Secondary | ICD-10-CM

## 2019-09-20 NOTE — Telephone Encounter (Signed)
Refill denied. Please contact pt to schedule OV with establish with new PCP, thanks!

## 2019-09-20 NOTE — Telephone Encounter (Signed)
Please review

## 2019-09-20 NOTE — Telephone Encounter (Signed)
See note

## 2019-10-01 ENCOUNTER — Other Ambulatory Visit: Payer: Self-pay

## 2019-10-02 ENCOUNTER — Encounter: Payer: Self-pay | Admitting: Family Medicine

## 2019-10-02 ENCOUNTER — Ambulatory Visit: Payer: BC Managed Care – PPO | Admitting: Family Medicine

## 2019-10-02 VITALS — BP 110/80 | HR 68 | Temp 97.9°F | Ht 65.5 in | Wt 153.4 lb

## 2019-10-02 DIAGNOSIS — N926 Irregular menstruation, unspecified: Secondary | ICD-10-CM

## 2019-10-02 DIAGNOSIS — Z3041 Encounter for surveillance of contraceptive pills: Secondary | ICD-10-CM | POA: Diagnosis not present

## 2019-10-02 DIAGNOSIS — N951 Menopausal and female climacteric states: Secondary | ICD-10-CM | POA: Diagnosis not present

## 2019-10-02 DIAGNOSIS — Z23 Encounter for immunization: Secondary | ICD-10-CM

## 2019-10-02 DIAGNOSIS — F900 Attention-deficit hyperactivity disorder, predominantly inattentive type: Secondary | ICD-10-CM | POA: Diagnosis not present

## 2019-10-02 HISTORY — DX: Encounter for surveillance of contraceptive pills: Z30.41

## 2019-10-02 MED ORDER — AMPHETAMINE-DEXTROAMPHET ER 20 MG PO CP24: 20.0000 mg | ORAL_CAPSULE | ORAL | 0 refills | Status: DC

## 2019-10-02 NOTE — Patient Instructions (Signed)
Please follow up as scheduled for your next visit with me: 10/22/2019   Today you were given your flu vaccination.   I have given you your printed prescriptions for your ADD medications. Please keep them in a safe place so you will have them monthly to take to your pharmacy for refills as they come due. I will not be able to reprint these prescriptions so please treat them like money and keep them safe.   It was a pleasure meeting you today! Thank you for choosing [place] to meet your healthcare needs! I truly look forward to working with you. If you have any questions or concerns, please send me a message via Mychart or call the office at [phone].

## 2019-10-02 NOTE — Progress Notes (Signed)
Subjective  CC:  Chief Complaint  Patient presents with   ADHD   Establish Care    HPI: Terri Clarke is a 48 y.o. female who presents to the office today to address the problems listed above in the chief complaint. Former pt of Dr. Juleen China. I've reviewed chart and labs. Overall a very healthy active 48 yo G0 with the following health issues. Will be here for cpe in 3 weeks.    Patient is here today for follow up of ADD/ADHD. 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 She has been on Adderall for 5-6 months and is has helped manage her work immensely.  She is taking medication as directed and continues to feel it is beneficial. The medications continue to help with focus and attention and task completion. She denies adverse side effects; specifically no headaches, appetite suppression, weight loss, sleeping difficulty, heart palpitations, chest pain or significant weight changes.  This patient does not have contraindications for stimulant use include hypertension, tachycardia, arrhythmia, psychosis, bipolar disorder, severe anorexia, and Tourette syndrome.   Had been on OCPs for irregular menses and hot flushes. Started about a year ago. Helpful. But has recently been taking it only intermittently. No other perimenopausal sxs. Non smoker. No h/o dvt nor FH of same.   Assessment  1. Attention deficit hyperactivity disorder (ADHD), predominantly inattentive type   2. Oral contraceptive use   3. Hot flushes, perimenopausal   4. Irregular menses      Plan   ADD:  Well controlled. Refilled med and gave 2 months of printed RXs. Works long day/hours so will monitor efficacy/duration and side effects at f/u visits   Perimenopausal hot flushes and irregular menses. Pt will stop OCPs and monitor her bleeding pattern and vasomotor sxs. Will check fsh. Then we will decide at f/u visit if ocps vs HRT  vs other will be best. Patient understands and agrees with care plan.   Follow up: cpe next visit  No orders of the defined types were placed in this encounter.  Meds ordered this encounter  Medications   DISCONTD: amphetamine-dextroamphetamine (ADDERALL XR) 20 MG 24 hr capsule    Sig: Take 1 capsule (20 mg total) by mouth every morning.    Dispense:  30 capsule    Refill:  0   DISCONTD: amphetamine-dextroamphetamine (ADDERALL XR) 20 MG 24 hr capsule    Sig: Take 1 capsule (20 mg total) by mouth every morning.    Dispense:  30 capsule    Refill:  0   amphetamine-dextroamphetamine (ADDERALL XR) 20 MG 24 hr capsule    Sig: Take 1 capsule (20 mg total) by mouth every morning.    Dispense:  30 capsule    Refill:  0      I reviewed the patients updated PMH, FH, and SocHx.    Patient Active Problem List   Diagnosis Date Noted   Oral contraceptive use 10/02/2019   Hot flushes, perimenopausal 10/02/2019   Irregular menses 10/02/2019   ADHD (attention deficit hyperactivity disorder) 01/31/2019   History of cold sores 07/11/2018   Current Meds  Medication Sig   [START ON 12/01/2019] amphetamine-dextroamphetamine (ADDERALL XR) 20 MG 24 hr capsule Take 1 capsule (20 mg total) by mouth every morning.   Multiple Vitamins-Minerals (WOMENS MULTIVITAMIN PO) Take by mouth.   SPRINTEC 28 0.25-35 MG-MCG tablet TAKE 1 TABLET BY MOUTH EVERY  DAY   [DISCONTINUED] amphetamine-dextroamphetamine (ADDERALL XR) 20 MG 24 hr capsule Take 1 capsule (20 mg total) by mouth every morning.   [DISCONTINUED] amphetamine-dextroamphetamine (ADDERALL XR) 20 MG 24 hr capsule Take 1 capsule (20 mg total) by mouth every morning.   [DISCONTINUED] amphetamine-dextroamphetamine (ADDERALL XR) 20 MG 24 hr capsule Take 1 capsule (20 mg total) by mouth every morning.    Allergies: Patient has No Known Allergies. Family History: Patient family history includes COPD in her mother; Diabetes in her maternal  grandmother; Healthy in her brother and sister; Hypertension in her sister; Lung cancer in her father; Migraines in her mother; Stroke in her father. Social History:  Patient  reports that she has never smoked. She has never used smokeless tobacco. She reports current alcohol use. She reports that she does not use drugs.  Review of Systems: Constitutional: Negative for fever malaise or anorexia Cardiovascular: negative for chest pain Respiratory: negative for SOB or persistent cough Gastrointestinal: negative for abdominal pain  Objective  Vitals: BP 110/80 (BP Location: Left Arm, Patient Position: Sitting, Cuff Size: Normal)    Pulse 68    Temp 97.9 F (36.6 C) (Skin)    Ht 5' 5.5" (1.664 m)    Wt 153 lb 6.4 oz (69.6 kg)    LMP 09/24/2019 (Within Days)    SpO2 97%    BMI 25.14 kg/m  General: no acute distress , A&Ox3 HEENT: PEERL, conjunctiva normal, Oropharynx moist,neck is supple Psych: normal affect, insight and speech Skin:  Warm, no rashes    Commons side effects, risks, benefits, and alternatives for medications and treatment plan prescribed today were discussed, and the patient expressed understanding of the given instructions. Patient is instructed to call or message via MyChart if he/she has any questions or concerns regarding our treatment plan. No barriers to understanding were identified. We discussed Red Flag symptoms and signs in detail. Patient expressed understanding regarding what to do in case of urgent or emergency type symptoms.   Medication list was reconciled, printed and provided to the patient in AVS. Patient instructions and summary information was reviewed with the patient as documented in the AVS. This note was prepared with assistance of Dragon voice recognition software. Occasional wrong-word or sound-a-like substitutions may have occurred due to the inherent limitations of voice recognition software

## 2019-10-19 ENCOUNTER — Other Ambulatory Visit: Payer: Self-pay

## 2019-10-22 ENCOUNTER — Encounter: Payer: Self-pay | Admitting: Family Medicine

## 2019-10-22 ENCOUNTER — Ambulatory Visit (INDEPENDENT_AMBULATORY_CARE_PROVIDER_SITE_OTHER): Payer: BC Managed Care – PPO | Admitting: Family Medicine

## 2019-10-22 ENCOUNTER — Other Ambulatory Visit: Payer: Self-pay

## 2019-10-22 VITALS — BP 132/72 | HR 67 | Temp 98.0°F | Ht 65.5 in | Wt 156.6 lb

## 2019-10-22 DIAGNOSIS — Z0000 Encounter for general adult medical examination without abnormal findings: Secondary | ICD-10-CM

## 2019-10-22 DIAGNOSIS — N926 Irregular menstruation, unspecified: Secondary | ICD-10-CM | POA: Diagnosis not present

## 2019-10-22 DIAGNOSIS — Z1211 Encounter for screening for malignant neoplasm of colon: Secondary | ICD-10-CM

## 2019-10-22 DIAGNOSIS — Z1231 Encounter for screening mammogram for malignant neoplasm of breast: Secondary | ICD-10-CM

## 2019-10-22 DIAGNOSIS — Z23 Encounter for immunization: Secondary | ICD-10-CM | POA: Diagnosis not present

## 2019-10-22 DIAGNOSIS — N951 Menopausal and female climacteric states: Secondary | ICD-10-CM | POA: Diagnosis not present

## 2019-10-22 DIAGNOSIS — F900 Attention-deficit hyperactivity disorder, predominantly inattentive type: Secondary | ICD-10-CM

## 2019-10-22 DIAGNOSIS — Z1212 Encounter for screening for malignant neoplasm of rectum: Secondary | ICD-10-CM

## 2019-10-22 NOTE — Patient Instructions (Signed)
Please return in 3 months for ADD follow up and hot flashes.  I will release your lab results to you on your MyChart account with further instructions. Please reply with any questions.  I will order hormone replacement therapy or restart your birth control pills to treat your hot flushed depending upon the results of your hormone test.   I recommend the Cologuard test for your colon cancer screening that is due. I have ordered this test for you. [facility] will soon contact you to verify your insurance, address etc. They will then send you the kit; follow the instructions in the kit and return the kit to [facility]. They will run the test and send the results to me. I will then give you the results. If this test is negative, we recommend repeating a colon cancer screening test in 3 years. If it is positive, I will refer you to a Gastroenterologist so you can get set up for the recommended colonoscopy.  Thank you!  I've ordered a mammogram. We will call you to get you scheduled.  If you have any questions or concerns, please don't hesitate to send me a message via MyChart or call the office at [phone]. Thank you for visiting with [place] today! It's our pleasure caring for you.   Preventive Care 18-39 Years Old, Female Preventive care refers to visits with your health care provider and lifestyle choices that can promote health and wellness. This includes:  A yearly physical exam. This may also be called an annual well check.  Regular dental visits and eye exams.  Immunizations.  Screening for certain conditions.  Healthy lifestyle choices, such as eating a healthy diet, getting regular exercise, not using drugs or products that contain nicotine and tobacco, and limiting alcohol use. What can I expect for my preventive care visit? Physical exam Your health care provider will check your:  Height and weight. This may be used to calculate body mass index (BMI), which tells if you are  at a healthy weight.  Heart rate and blood pressure.  Skin for abnormal spots. Counseling Your health care provider may ask you questions about your:  Alcohol, tobacco, and drug use.  Emotional well-being.  Home and relationship well-being.  Sexual activity.  Eating habits.  Work and work Statistician.  Method of birth control.  Menstrual cycle.  Pregnancy history. What immunizations do I need?  Influenza (flu) vaccine  This is recommended every year. Tetanus, diphtheria, and pertussis (Tdap) vaccine  You may need a Td booster every 10 years. Varicella (chickenpox) vaccine  You may need this if you have not been vaccinated. Zoster (shingles) vaccine  You may need this after age 25. Measles, mumps, and rubella (MMR) vaccine  You may need at least one dose of MMR if you were born in 1957 or later. You may also need a second dose. Pneumococcal conjugate (PCV13) vaccine  You may need this if you have certain conditions and were not previously vaccinated. Pneumococcal polysaccharide (PPSV23) vaccine  You may need one or two doses if you smoke cigarettes or if you have certain conditions. Meningococcal conjugate (MenACWY) vaccine  You may need this if you have certain conditions. Hepatitis A vaccine  You may need this if you have certain conditions or if you travel or work in places where you may be exposed to hepatitis A. Hepatitis B vaccine  You may need this if you have certain conditions or if you travel or work in places where you may  be exposed to hepatitis B. Haemophilus influenzae type b (Hib) vaccine  You may need this if you have certain conditions. Human papillomavirus (HPV) vaccine  If recommended by your health care provider, you may need three doses over 6 months. You may receive vaccines as individual doses or as more than one vaccine together in one shot (combination vaccines). Talk with your health care provider about the risks and benefits of  combination vaccines. What tests do I need? Blood tests  Lipid and cholesterol levels. These may be checked every 5 years, or more frequently if you are over 86 years old.  Hepatitis C test.  Hepatitis B test. Screening  Lung cancer screening. You may have this screening every year starting at age 61 if you have a 30-pack-year history of smoking and currently smoke or have quit within the past 15 years.  Colorectal cancer screening. All adults should have this screening starting at age 44 and continuing until age 34. Your health care provider may recommend screening at age 24 if you are at increased risk. You will have tests every 1-10 years, depending on your results and the type of screening test.  Diabetes screening. This is done by checking your blood sugar (glucose) after you have not eaten for a while (fasting). You may have this done every 1-3 years.  Mammogram. This may be done every 1-2 years. Talk with your health care provider about when you should start having regular mammograms. This may depend on whether you have a family history of breast cancer.  BRCA-related cancer screening. This may be done if you have a family history of breast, ovarian, tubal, or peritoneal cancers.  Pelvic exam and Pap test. This may be done every 3 years starting at age 46. Starting at age 23, this may be done every 5 years if you have a Pap test in combination with an HPV test. Other tests  Sexually transmitted disease (STD) testing.  Bone density scan. This is done to screen for osteoporosis. You may have this scan if you are at high risk for osteoporosis. Follow these instructions at home: Eating and drinking  Eat a diet that includes fresh fruits and vegetables, whole grains, lean protein, and low-fat dairy.  Take vitamin and mineral supplements as recommended by your health care provider.  Do not drink alcohol if: ? Your health care provider tells you not to drink. ? You are pregnant,  may be pregnant, or are planning to become pregnant.  If you drink alcohol: ? Limit how much you have to 0-1 drink a day. ? Be aware of how much alcohol is in your drink. In the [place] one drink equals one 12 oz bottle of beer (355 mL), one 5 oz glass of wine (148 mL), or one 1 oz glass of hard liquor (44 mL). Lifestyle  Take daily care of your teeth and gums.  Stay active. Exercise for at least 30 minutes on 5 or more days each week.  Do not use any products that contain nicotine or tobacco, such as cigarettes, e-cigarettes, and chewing tobacco. If you need help quitting, ask your health care provider.  If you are sexually active, practice safe sex. Use a condom or other form of birth control (contraception) in order to prevent pregnancy and STIs (sexually transmitted infections).  If told by your health care provider, take low-dose aspirin daily starting at age 21. What's next?  Visit your health care provider once a year for a well check visit.  Ask your health care provider how often you should have your eyes and teeth checked.  Stay up to date on all vaccines. This information is not intended to replace advice given to you by your health care provider. Make sure you discuss any questions you have with your health care provider. Document Released: 11/14/2015 Document Revised: 06/29/2018 Document Reviewed: 06/29/2018 Elsevier Patient Education  2020 [employer].

## 2019-10-22 NOTE — Progress Notes (Signed)
Subjective  Chief Complaint  Patient presents with  . Annual Exam  . ADHD    HPI: Terri Clarke is a 48 y.o. female who presents to [facility] today for a Female Wellness Visit. She also has the concerns and/or needs as listed above in the chief complaint. These will be addressed in addition to the Health Maintenance Visit.   Wellness Visit: annual visit with health maintenance review and exam without Pap   HM: due mammogram and crc screen. Pap is up to date: nl and neg HR HPV 2017. She stopped her OCPs, no withdrawal bleed but having hot flushes interfering with sleep. Menses were irregular on ocps as well. No pelvic pain.  Chronic disease f/u and/or acute problem visit: (deemed necessary to be done in addition to the wellness visit):  ADD: doing well on current dose of meds.   Perimenopausal: as above, wants help with symptomatic hot flushes. No contraindications to hrt.   Assessment  1. Annual physical exam   2. Hot flushes, perimenopausal   3. Irregular menses   4. Attention deficit hyperactivity disorder (ADHD), predominantly inattentive type   5. Encounter for screening mammogram for malignant neoplasm of breast   6. Screening for colorectal cancer      Plan  Female Wellness Visit:  Age appropriate Health Maintenance and Prevention measures were discussed with patient. Included topics are cancer screening recommendations, ways to keep healthy (see AVS) including dietary and exercise recommendations, regular eye and dental care, use of seat belts, and avoidance of moderate alcohol use and tobacco use. Discussed breast and colon ca screen: mammo and cologuard ordered.   BMI: discussed patient's BMI and encouraged positive lifestyle modifications to help get to or maintain a target BMI.  HM needs and immunizations were addressed and ordered. See below for orders. See HM and immunization section for updates.  Routine labs and screening tests  ordered including cmp, cbc and lipids where appropriate.  Discussed recommendations regarding Vit D and calcium supplementation (see AVS)  Chronic disease management visit and/or acute problem visit:  Check fsh: hrt if postmenopausal. ocp restart if not. Pt agrees.   Add is stable. Recheck 3 months . Follow up: 3 months for hot flush and add f/u.  Orders Placed This Encounter  Procedures  . CBC w/Diff  . CMP  . Lipids  . [place]  . TSH  . HIV antibody   No orders of the defined types were placed in this encounter.     Lifestyle: Body mass index is 25.66 kg/m. Wt Readings from Last 3 Encounters:  10/22/19 156 lb 9.6 oz (71 kg)  10/02/19 153 lb 6.4 oz (69.6 kg)  01/30/19 145 lb (65.8 kg)    Patient Active Problem List   Diagnosis Date Noted  . Oral contraceptive use 10/02/2019  . Hot flushes, perimenopausal 10/02/2019  . Irregular menses 10/02/2019  . ADHD (attention deficit hyperactivity disorder) 01/31/2019  . History of cold sores 07/11/2018   Health Maintenance  Topic Date Due  . HIV Screening  02/19/1984  . MAMMOGRAM  02/19/1987  . TETANUS/TDAP  11/01/2018  . COLONOSCOPY  02/19/2019  . PAP SMEAR-Modifier  09/27/2021   Immunization History  Administered Date(s) Administered  . Influenza,inj,Quad PF,6+ Mos 10/02/2019  . Tdap 11/01/2008   We updated and reviewed the patient's past history in detail and it is documented below. Allergies: Patient has No Known Allergies. Past Medical History Patient  has a past medical history of ADD (  attention deficit disorder) and Oral contraceptive use (10/02/2019). Past Surgical History Patient  has no past surgical history on file. Family History: Patient family history includes COPD in her mother; Diabetes in her maternal grandmother; Healthy in her brother and sister; Hypertension in her sister; Lung cancer in her father; Migraines in her mother; Stroke in her father. Social History:  Patient  reports that she has never  smoked. She has never used smokeless tobacco. She reports current alcohol use. She reports that she does not use drugs.  Review of Systems: Constitutional: negative for fever or malaise Ophthalmic: negative for photophobia, double vision or loss of vision Cardiovascular: negative for chest pain, dyspnea on exertion, or new LE swelling Respiratory: negative for SOB or persistent cough Gastrointestinal: negative for abdominal pain, change in bowel habits or melena Genitourinary: negative for dysuria or gross hematuria, no abnormal uterine bleeding or disharge Musculoskeletal: negative for new gait disturbance or muscular weakness Integumentary: negative for new or persistent rashes, no breast lumps Neurological: negative for TIA or stroke symptoms Psychiatric: negative for SI or delusions Allergic/Immunologic: negative for hives  Patient Care Team    Relationship Specialty Notifications Start End  Leamon Arnt, MD PCP - General Family Medicine  10/02/19     Objective  Vitals: BP 132/72 (BP Location: Right Arm, Patient Position: Sitting, Cuff Size: Normal)   Pulse 67   Temp 98 F (36.7 C) (Temporal)   Ht 5' 5.5" (1.664 m)   Wt 156 lb 9.6 oz (71 kg)   LMP 09/24/2019 (Within Days)   SpO2 99%   BMI 25.66 kg/m  General:  Well developed, well nourished, no acute distress  Psych:  Alert and orientedx3,normal mood and affect HEENT:  Normocephalic, atraumatic, non-icteric sclera, PERRL, oropharynx is clear without mass or exudate, supple neck without adenopathy, mass or thyromegaly Cardiovascular:  Normal S1, S2, RRR without gallop, rub or murmur, nondisplaced PMI Respiratory:  Good breath sounds bilaterally, CTAB with normal respiratory effort Gastrointestinal: normal bowel sounds, soft, non-tender, no noted masses. No HSM MSK: no deformities, contusions. Joints are without erythema or swelling. Spine and CVA region are nontender Skin:  Warm, no rashes or suspicious lesions  noted Neurologic:    Mental status is normal. CN 2-11 are normal. Gross motor and sensory exams are normal. Normal gait. No tremor    Commons side effects, risks, benefits, and alternatives for medications and treatment plan prescribed today were discussed, and the patient expressed understanding of the given instructions. Patient is instructed to call or message via MyChart if he/she has any questions or concerns regarding our treatment plan. No barriers to understanding were identified. We discussed Red Flag symptoms and signs in detail. Patient expressed understanding regarding what to do in case of urgent or emergency type symptoms.   Medication list was reconciled, printed and provided to the patient in AVS. Patient instructions and summary information was reviewed with the patient as documented in the AVS. This note was prepared with assistance of Dragon voice recognition software. Occasional wrong-word or sound-a-like substitutions may have occurred due to the inherent limitations of voice recognition software  This visit occurred during the SARS-CoV-2 public health emergency.  Safety protocols were in place, including screening questions prior to the visit, additional usage of staff PPE, and extensive cleaning of exam room while observing appropriate contact time as indicated for disinfecting solutions.

## 2019-10-23 LAB — LIPID PANEL
Cholesterol: 220 mg/dL — ABNORMAL HIGH (ref 0–200)
HDL: 109.6 mg/dL (ref >39.00)
LDL Cholesterol: 102 mg/dL — ABNORMAL HIGH (ref 0–99)
NonHDL: 110.26
Total CHOL/HDL Ratio: 2
Triglycerides: 40 mg/dL (ref 0.0–149.0)
VLDL: 8 mg/dL (ref 0.0–40.0)

## 2019-10-23 LAB — COMPREHENSIVE METABOLIC PANEL
ALT: 19 U/L (ref 0–35)
AST: 20 U/L (ref 0–37)
Albumin: 4.8 g/dL (ref 3.5–5.2)
Alkaline Phosphatase: 71 U/L (ref 39–117)
BUN: 14 mg/dL (ref 6–23)
CO2: 30 mEq/L (ref 19–32)
Calcium: 10.4 mg/dL (ref 8.4–10.5)
Chloride: 100 mEq/L (ref 96–112)
Creatinine, Ser: 0.85 mg/dL (ref 0.40–1.20)
GFR: 70.6 mL/min (ref >60.00)
Glucose, Bld: 103 mg/dL — ABNORMAL HIGH (ref 70–99)
Potassium: 4.8 mEq/L (ref 3.5–5.1)
Sodium: 138 mEq/L (ref 135–145)
Total Bilirubin: 0.5 mg/dL (ref 0.2–1.2)
Total Protein: 7.2 g/dL (ref 6.0–8.3)

## 2019-10-23 LAB — CBC WITH DIFFERENTIAL/PLATELET
Basophils Absolute: 0.1 10*3/uL (ref 0.0–0.1)
Basophils Relative: 1.5 % (ref 0.0–3.0)
Eosinophils Absolute: 0.1 10*3/uL (ref 0.0–0.7)
Eosinophils Relative: 2 % (ref 0.0–5.0)
HCT: 42.6 % (ref 36.0–46.0)
Hemoglobin: 14.3 g/dL (ref 12.0–15.0)
Lymphocytes Relative: 27.3 % (ref 12.0–46.0)
Lymphs Abs: 1.7 10*3/uL (ref 0.7–4.0)
MCHC: 33.4 g/dL (ref 30.0–36.0)
MCV: 90.6 fl (ref 78.0–100.0)
Monocytes Absolute: 0.4 10*3/uL (ref 0.1–1.0)
Monocytes Relative: 6.3 % (ref 3.0–12.0)
Neutro Abs: 4 10*3/uL (ref 1.4–7.7)
Neutrophils Relative %: 62.9 % (ref 43.0–77.0)
Platelets: 253 10*3/uL (ref 150.0–400.0)
RBC: 4.71 Mil/uL (ref 3.87–5.11)
RDW: 13 % (ref 11.5–15.5)
WBC: 6.4 10*3/uL (ref 4.0–10.5)

## 2019-10-23 LAB — TSH: TSH: 2.93 u[IU]/mL (ref 0.35–4.50)

## 2019-10-23 LAB — FOLLICLE STIMULATING HORMONE: FSH: 78.3 m[IU]/mL

## 2019-10-23 LAB — HIV ANTIBODY (ROUTINE TESTING W REFLEX): HIV 1&2 Ab, 4th Generation: NONREACTIVE

## 2019-10-23 MED ORDER — CLIMARA PRO 0.045-0.015 MG/DAY TD PTWK: 1.0000 | MEDICATED_PATCH | TRANSDERMAL | 11 refills | Status: DC

## 2019-10-23 NOTE — Addendum Note (Signed)
Addended by: Billey Chang on: 10/23/2019 12:23 PM   Modules accepted: Orders

## 2019-11-01 ENCOUNTER — Encounter: Payer: Self-pay | Admitting: Family Medicine

## 2019-11-05 NOTE — Telephone Encounter (Signed)
Please advise 

## 2019-11-07 MED ORDER — COMBIPATCH 0.05-0.25 MG/DAY TD PTTW: 1.0000 | MEDICATED_PATCH | TRANSDERMAL | 11 refills | Status: DC

## 2019-11-07 NOTE — Telephone Encounter (Signed)
Patient prefers the patches to be sent in. Thanks.

## 2019-11-07 NOTE — Addendum Note (Signed)
Addended by: Billey Chang on: 11/07/2019 02:39 PM   Modules accepted: Orders

## 2019-12-10 ENCOUNTER — Other Ambulatory Visit: Payer: Self-pay

## 2019-12-10 ENCOUNTER — Ambulatory Visit
Admission: RE | Admit: 2019-12-10 | Discharge: 2019-12-10 | Disposition: A | Discharge status: Home / Self Care | Payer: BC Managed Care – PPO | Source: Ambulatory Visit | Attending: Family Medicine | Admitting: Family Medicine

## 2019-12-10 ENCOUNTER — Other Ambulatory Visit: Payer: Self-pay | Admitting: Family Medicine

## 2019-12-10 DIAGNOSIS — Z0000 Encounter for general adult medical examination without abnormal findings: Secondary | ICD-10-CM

## 2019-12-10 DIAGNOSIS — Z1231 Encounter for screening mammogram for malignant neoplasm of breast: Secondary | ICD-10-CM

## 2019-12-29 ENCOUNTER — Ambulatory Visit: Payer: BC Managed Care – PPO | Attending: Internal Medicine

## 2019-12-29 ENCOUNTER — Ambulatory Visit: Payer: BC Managed Care – PPO

## 2019-12-29 DIAGNOSIS — Z23 Encounter for immunization: Secondary | ICD-10-CM

## 2019-12-29 NOTE — Progress Notes (Signed)
   Covid-19 Vaccination Clinic  Name:  Terri Clarke    MRN: [id number] DOB: 1970/11/09  12/29/2019  [name] was observed post Covid-19 immunization for 15 minutes without incidence. She was provided with Vaccine Information Sheet and instruction to access the V-Safe system.   [name] was instructed to call 911 with any severe reactions post vaccine: Marland Kitchen Difficulty breathing  . Swelling of your face and throat  . A fast heartbeat  . A bad rash all over your body  . Dizziness and weakness    Immunizations Administered    Name Date Dose VIS Date Route   Pfizer COVID-19 Vaccine 12/29/2019  5:47 PM 0.3 mL 10/12/2019 Intramuscular   Manufacturer: [place]   Lot: [id number]   [place]: [id number]

## 2020-01-15 ENCOUNTER — Encounter: Payer: Self-pay | Admitting: Family Medicine

## 2020-01-18 ENCOUNTER — Ambulatory Visit (INDEPENDENT_AMBULATORY_CARE_PROVIDER_SITE_OTHER): Payer: BC Managed Care – PPO | Admitting: Family Medicine

## 2020-01-18 ENCOUNTER — Encounter: Payer: Self-pay | Admitting: Family Medicine

## 2020-01-18 ENCOUNTER — Other Ambulatory Visit: Payer: Self-pay

## 2020-01-18 VITALS — BP 126/76 | HR 53 | Temp 97.2°F | Ht 65.5 in | Wt 156.2 lb

## 2020-01-18 DIAGNOSIS — Z79890 Hormone replacement therapy (postmenopausal): Secondary | ICD-10-CM

## 2020-01-18 DIAGNOSIS — N951 Menopausal and female climacteric states: Secondary | ICD-10-CM

## 2020-01-18 DIAGNOSIS — F900 Attention-deficit hyperactivity disorder, predominantly inattentive type: Secondary | ICD-10-CM

## 2020-01-18 MED ORDER — AMPHETAMINE-DEXTROAMPHET ER 20 MG PO CP24: 20.0000 mg | ORAL_CAPSULE | ORAL | 0 refills | Status: DC

## 2020-01-18 MED ORDER — NORGESTIMATE-ETH ESTRADIOL 0.25-35 MG-MCG PO TABS: 1.0000 | ORAL_TABLET | Freq: Every day | ORAL | 3 refills | Status: DC

## 2020-01-18 NOTE — Patient Instructions (Signed)
Please return in 6 months for ADD recheck.   I have given you your printed prescriptions for your ADD medications. Please keep them in a safe place so you will have them monthly to take to your pharmacy for refills as they come due. I will not be able to reprint these prescriptions so please treat them like money and keep them safe.   I've ordered a month of adderall and the birth control pills and sent to your pharmacy. Stop the patches.   If you have any questions or concerns, please don't hesitate to send me a message via MyChart or call the office at [phone]. Thank you for visiting with [place] today! It's our pleasure caring for you.

## 2020-01-18 NOTE — Progress Notes (Signed)
Subjective  CC:  Chief Complaint  Patient presents with  . ADHD    taking adderall 20 mg daily. helps with sx  . Hot Flashes    patches are not helping much    HPI: Terri Clarke is a 49 y.o. female who presents to the office today to address the problems listed above in the chief complaint.  Patient is here today for follow up of ADD/ADHD. She is taking medication as directed and continues to feel it is beneficial. The medications continue to help with focus and attention and task completion. She denies adverse side effects; specifically no headaches, appetite suppression, weight loss, sleeping difficulty, heart palpitations, chest pain or significant weight changes.  This patient does not have contraindications for stimulant use include hypertension, tachycardia, arrhythmia, psychosis, bipolar disorder, severe anorexia, and Tourette syndrome.  Hot flushes not responding to HRT; would prefer to be back on ocps. No contraindications. Not sleeping due to hot flushes.   Assessment  1. Attention deficit hyperactivity disorder (ADHD), predominantly inattentive type   2. Post-menopause on HRT (hormone replacement therapy)   3. Menopausal hot flushes      Plan   ADD:  Well controlled. Refilled meds x 6 months. 5 months printed given.   Hot flushes; change back to ocps. Educated on risks/benefits.   Follow up: Return in about 6 months (around 07/20/2020) for follow up on ADD.  No orders of the defined types were placed in this encounter.  Meds ordered this encounter  Medications  . norgestimate-ethinyl estradiol (SPRINTEC 28) 0.25-35 MG-MCG tablet    Sig: Take 1 tablet by mouth daily.    Dispense:  3 Package    Refill:  3  . DISCONTD: amphetamine-dextroamphetamine (ADDERALL XR) 20 MG 24 hr capsule    Sig: Take 1 capsule (20 mg total) by mouth every morning.    Dispense:  30 capsule    Refill:  0  . DISCONTD: amphetamine-dextroamphetamine (ADDERALL XR) 20 MG 24 hr capsule   Sig: Take 1 capsule (20 mg total) by mouth every morning.    Dispense:  30 capsule    Refill:  0  . DISCONTD: amphetamine-dextroamphetamine (ADDERALL XR) 20 MG 24 hr capsule    Sig: Take 1 capsule (20 mg total) by mouth every morning.    Dispense:  30 capsule    Refill:  0  . DISCONTD: amphetamine-dextroamphetamine (ADDERALL XR) 20 MG 24 hr capsule    Sig: Take 1 capsule (20 mg total) by mouth every morning.    Dispense:  30 capsule    Refill:  0  . DISCONTD: amphetamine-dextroamphetamine (ADDERALL XR) 20 MG 24 hr capsule    Sig: Take 1 capsule (20 mg total) by mouth every morning.    Dispense:  30 capsule    Refill:  0  . DISCONTD: amphetamine-dextroamphetamine (ADDERALL XR) 20 MG 24 hr capsule    Sig: Take 1 capsule (20 mg total) by mouth every morning.    Dispense:  30 capsule    Refill:  0  . amphetamine-dextroamphetamine (ADDERALL XR) 20 MG 24 hr capsule    Sig: Take 1 capsule (20 mg total) by mouth every morning.    Dispense:  30 capsule    Refill:  0      I reviewed the patients updated PMH, FH, and SocHx.    Patient Active Problem List   Diagnosis Date Noted  . Menopausal hot flushes 10/02/2019  . ADHD (attention deficit hyperactivity disorder) 01/31/2019  . History of  cold sores 07/11/2018   Current Meds  Medication Sig  . [START ON 06/16/2020] amphetamine-dextroamphetamine (ADDERALL XR) 20 MG 24 hr capsule Take 1 capsule (20 mg total) by mouth every morning.  . Multiple Vitamins-Minerals (WOMENS MULTIVITAMIN PO) Take by mouth.  . [DISCONTINUED] amphetamine-dextroamphetamine (ADDERALL XR) 20 MG 24 hr capsule Take 1 capsule (20 mg total) by mouth every morning.  . [DISCONTINUED] amphetamine-dextroamphetamine (ADDERALL XR) 20 MG 24 hr capsule Take 1 capsule (20 mg total) by mouth every morning.  . [DISCONTINUED] amphetamine-dextroamphetamine (ADDERALL XR) 20 MG 24 hr capsule Take 1 capsule (20 mg total) by mouth every morning.  . [DISCONTINUED]  amphetamine-dextroamphetamine (ADDERALL XR) 20 MG 24 hr capsule Take 1 capsule (20 mg total) by mouth every morning.  . [DISCONTINUED] amphetamine-dextroamphetamine (ADDERALL XR) 20 MG 24 hr capsule Take 1 capsule (20 mg total) by mouth every morning.  . [DISCONTINUED] amphetamine-dextroamphetamine (ADDERALL XR) 20 MG 24 hr capsule Take 1 capsule (20 mg total) by mouth every morning.  . [DISCONTINUED] amphetamine-dextroamphetamine (ADDERALL XR) 20 MG 24 hr capsule Take 1 capsule (20 mg total) by mouth every morning.  . [DISCONTINUED] CLIMARA PRO 0.045-0.015 MG/DAY Place 1 patch onto the skin once a week.    Allergies: Patient has No Known Allergies. Family History: Patient family history includes COPD in her mother; Diabetes in her maternal grandmother; Healthy in her brother and sister; Hypertension in her sister; Lung cancer in her father; Migraines in her mother; Stroke in her father. Social History:  Patient  reports that she has never smoked. She has never used smokeless tobacco. She reports current alcohol use. She reports that she does not use drugs.  Review of Systems: Constitutional: Negative for fever malaise or anorexia Cardiovascular: negative for chest pain Respiratory: negative for SOB or persistent cough Gastrointestinal: negative for abdominal pain  Objective  Vitals: BP 126/76 (BP Location: Right Arm, Patient Position: Sitting, Cuff Size: Normal)   Pulse (!) 53   Temp (!) 97.2 F (36.2 C) (Temporal)   Ht 5' 5.5" (1.664 m)   Wt 156 lb 3.2 oz (70.9 kg)   LMP 01/01/2020 (Approximate)   SpO2 99%   BMI 25.60 kg/m  General: no acute distress , A&Ox3     Commons side effects, risks, benefits, and alternatives for medications and treatment plan prescribed today were discussed, and the patient expressed understanding of the given instructions. Patient is instructed to call or message via MyChart if he/she has any questions or concerns regarding our treatment plan. No  barriers to understanding were identified. We discussed Red Flag symptoms and signs in detail. Patient expressed understanding regarding what to do in case of urgent or emergency type symptoms.   Medication list was reconciled, printed and provided to the patient in AVS. Patient instructions and summary information was reviewed with the patient as documented in the AVS. This note was prepared with assistance of Dragon voice recognition software. Occasional wrong-word or sound-a-like substitutions may have occurred due to the inherent limitations of voice recognition software

## 2020-01-19 ENCOUNTER — Ambulatory Visit: Payer: BC Managed Care – PPO

## 2020-01-19 ENCOUNTER — Ambulatory Visit: Payer: BC Managed Care – PPO | Attending: Internal Medicine

## 2020-01-19 DIAGNOSIS — Z23 Encounter for immunization: Secondary | ICD-10-CM

## 2020-01-19 NOTE — Progress Notes (Signed)
   Covid-19 Vaccination Clinic  Name:  Terri Clarke    MRN: [id number] DOB: [birth year]  01/19/2020  [name] was observed post Covid-19 immunization for 15 minutes without incident. She was provided with Vaccine Information Sheet and instruction to access the V-Safe system.   [name] was instructed to call 911 with any severe reactions post vaccine: Marland Kitchen Difficulty breathing  . Swelling of face and throat  . A fast heartbeat  . A bad rash all over body  . Dizziness and weakness   Immunizations Administered    Name Date Dose VIS Date Route   Pfizer COVID-19 Vaccine 01/19/2020  9:34 AM 0.3 mL 10/12/2019 Intramuscular   Manufacturer: [place]   Lot: [id number]   [place]: [id number]

## 2020-01-21 ENCOUNTER — Ambulatory Visit: Payer: BC Managed Care – PPO | Admitting: Family Medicine

## 2020-01-25 ENCOUNTER — Encounter: Payer: Self-pay | Admitting: Family Medicine

## 2020-01-25 MED ORDER — NORGESTIMATE-ETH ESTRADIOL 0.25-35 MG-MCG PO TABS: 1.0000 | ORAL_TABLET | Freq: Every day | ORAL | 3 refills | Status: DC

## 2020-06-18 ENCOUNTER — Ambulatory Visit: Payer: BC Managed Care – PPO | Admitting: Nurse Practitioner

## 2020-06-18 ENCOUNTER — Encounter: Payer: Self-pay | Admitting: Nurse Practitioner

## 2020-06-18 ENCOUNTER — Other Ambulatory Visit: Payer: Self-pay

## 2020-06-18 VITALS — BP 116/74 | HR 52 | Temp 96.9°F | Wt 162.4 lb

## 2020-06-18 DIAGNOSIS — W57XXXA Bitten or stung by nonvenomous insect and other nonvenomous arthropods, initial encounter: Secondary | ICD-10-CM

## 2020-06-18 DIAGNOSIS — S90561A Insect bite (nonvenomous), right ankle, initial encounter: Secondary | ICD-10-CM | POA: Diagnosis not present

## 2020-06-18 MED ORDER — IBUPROFEN 600 MG PO TABS: 600.0000 mg | ORAL_TABLET | Freq: Three times a day (TID) | ORAL | 0 refills | Status: DC | PRN

## 2020-06-18 MED ORDER — DOUBLE ANTIBIOTIC 500-10000 UNIT/GM EX OINT: 1.0000 "application " | TOPICAL_OINTMENT | Freq: Two times a day (BID) | CUTANEOUS | 0 refills | Status: DC

## 2020-06-18 NOTE — Patient Instructions (Signed)

## 2020-06-18 NOTE — Progress Notes (Signed)
   Subjective:  Patient ID: Terri Clarke, female    DOB: [birth year]  Age: 49 y.o. MRN: [id number]  CC: Insect Bite (Stepped in ant hill on last Wednesday and experiencing bilateral foot pain, rt foot worse than left. )  Rash This is a new problem. The current episode started 1 to 4 weeks ago. The problem is unchanged. The affected locations include the left ankle and right ankle. The rash is characterized by pain, redness and swelling. She was exposed to an insect bite/sting (fire ants).  UTD with TDAP.  Reviewed past Medical, Social and Family history today.  Outpatient Medications Prior to Visit  Medication Sig Dispense Refill  . amphetamine-dextroamphetamine (ADDERALL XR) 20 MG 24 hr capsule Take 1 capsule (20 mg total) by mouth every morning. 30 capsule 0  . Multiple Vitamins-Minerals (WOMENS MULTIVITAMIN PO) Take by mouth.    . norgestimate-ethinyl estradiol (SPRINTEC 28) 0.25-35 MG-MCG tablet Take 1 tablet by mouth daily. 3 Package 3   No facility-administered medications prior to visit.    ROS See HPI  Objective:  BP 116/74 (BP Location: Left Arm, Patient Position: Sitting, Cuff Size: Normal)   Pulse (!) 52   Temp (!) 96.9 F (36.1 C) (Temporal)   Wt 162 lb 6.4 oz (73.7 kg)   SpO2 97%   BMI 26.61 kg/m   Physical Exam Constitutional:      General: She is not in acute distress. Pulmonary:     Effort: Pulmonary effort is normal.  Skin:    Findings: Rash present. Rash is pustular and vesicular.       Neurological:     Mental Status: She is alert and oriented to person, place, and time.    Assessment & Plan:  This visit occurred during the SARS-CoV-2 public health emergency.  Safety protocols were in place, including screening questions prior to the visit, additional usage of staff PPE, and extensive cleaning of exam room while observing appropriate contact time as indicated for disinfecting solutions.   Ana was seen today for insect bite.  Diagnoses and all  orders for this visit:  Insect bite of right ankle, initial encounter -     polymixin-bacitracin (POLYSPORIN) 500-10000 UNIT/GM OINT ointment; Apply 1 application topically 2 (two) times daily. x3days -     ibuprofen (ADVIL) 600 MG tablet; Take 1 tablet (600 mg total) by mouth every 8 (eight) hours as needed (with food).    Problem List Items Addressed This Visit    None    Visit Diagnoses    Insect bite of right ankle, initial encounter    -  Primary   Relevant Medications   polymixin-bacitracin (POLYSPORIN) 500-10000 UNIT/GM OINT ointment   ibuprofen (ADVIL) 600 MG tablet      Follow-up: No follow-ups on file.  Wilfred Lacy, NP

## 2020-06-24 ENCOUNTER — Other Ambulatory Visit: Payer: Self-pay | Admitting: Nurse Practitioner

## 2020-06-24 DIAGNOSIS — W57XXXA Bitten or stung by nonvenomous insect and other nonvenomous arthropods, initial encounter: Secondary | ICD-10-CM

## 2020-06-24 NOTE — Telephone Encounter (Signed)
Medication refill request declined at this time. Last refill 06/18/20 //#18 // refills - 0.

## 2020-07-21 ENCOUNTER — Ambulatory Visit: Payer: BC Managed Care – PPO | Admitting: Family Medicine

## 2021-05-19 ENCOUNTER — Encounter: Payer: Self-pay | Admitting: Family Medicine

## 2021-05-25 ENCOUNTER — Other Ambulatory Visit: Payer: Self-pay

## 2021-05-25 ENCOUNTER — Ambulatory Visit: Payer: BC Managed Care – PPO | Admitting: Family Medicine

## 2021-05-25 ENCOUNTER — Encounter: Payer: Self-pay | Admitting: Family Medicine

## 2021-05-25 VITALS — BP 109/73 | HR 61 | Temp 97.5°F | Ht 65.5 in | Wt 168.6 lb

## 2021-05-25 DIAGNOSIS — F900 Attention-deficit hyperactivity disorder, predominantly inattentive type: Secondary | ICD-10-CM

## 2021-05-25 DIAGNOSIS — Z23 Encounter for immunization: Secondary | ICD-10-CM | POA: Diagnosis not present

## 2021-05-25 DIAGNOSIS — N951 Menopausal and female climacteric states: Secondary | ICD-10-CM | POA: Diagnosis not present

## 2021-05-25 DIAGNOSIS — Z1211 Encounter for screening for malignant neoplasm of colon: Secondary | ICD-10-CM

## 2021-05-25 MED ORDER — NORGESTIMATE-ETH ESTRADIOL 0.25-35 MG-MCG PO TABS: 1.0000 | ORAL_TABLET | Freq: Every day | ORAL | 3 refills | Status: DC

## 2021-05-25 MED ORDER — AMPHETAMINE-DEXTROAMPHET ER 20 MG PO CP24: 20.0000 mg | ORAL_CAPSULE | ORAL | 0 refills | Status: DC

## 2021-05-25 NOTE — Patient Instructions (Signed)
It was very nice to see you today!  We will refill your medications today.  We will give your shingles vaccine.  Please come back in a few months for your physical with Dr. Jonni Sanger  Take care, Dr Jerline Pain  PLEASE NOTE:  If you had any lab tests please let us know if you have not heard back within a few days. You may see your results on mychart before we have a chance to review them but we will give you a call once they are reviewed by [place]. If we ordered any referrals today, please let us know if you have not heard from their office within the next week.   Please try these tips to maintain a healthy lifestyle:  Eat at least 3 REAL meals and 1-2 snacks per day.  Aim for no more than 5 hours between eating.  If you eat breakfast, please do so within one hour of getting up.   Each meal should contain half fruits/vegetables, one quarter protein, and one quarter carbs (no bigger than a computer mouse)  Cut down on sweet beverages. This includes juice, soda, and sweet tea.   Drink at least 1 glass of water with each meal and aim for at least 8 glasses per day  Exercise at least 150 minutes every week.

## 2021-05-25 NOTE — Progress Notes (Signed)
   Terri Clarke is a 50 y.o. female who presents today for an office visit.  Assessment/Plan:  Chronic Problems Addressed Today: Menopausal hot flushes Has done well with Sprintec in the past.  We will refill today.  ADHD (attention deficit hyperactivity disorder) Database without red flags.  She is on Adderall 20 mg daily as needed.  Uses very sparingly.  No side effects.  Medications help with ability to stay focused and on task.  Will refill today.  Advised her to follow-up with her PCP soon.  [facility]  we will place order for colonoscopy per patient request.  First dose Shingrix given today.  She can get second dose at her cpe    Subjective:  HPI:  PAtient here for follow up. PCP not available today. Needs refill on OCP and adderall.  CC of the patient is Hot Flashes. She was given Estradiol 01/25/2020 to alleviate symptoms, which has been effective so far. She denies palpitations or sleeping difficulties related to the medication, however the hot flashes themselves cause sleeping problems. They started around 3 years ago.   She had Shingles in her eye a couple months ago, where she went to her ophthalmologist for. Because of this she expresses interest in having the Shingles vaccine.   She has not had a colonoscopy and expresses interest in scheduling it.        Objective:  Physical Exam: BP 109/73   Pulse 61   Temp (!) 97.5 F (36.4 C) (Temporal)   Ht 5' 5.5" (1.664 m)   Wt 168 lb 9.6 oz (76.5 kg)   SpO2 100%   BMI 27.63 kg/m   Gen: No acute distress, resting comfortably CV: Regular rate and rhythm with no murmurs appreciated Pulm: Normal work of breathing, clear to auscultation bilaterally with no crackles, wheezes, or rhonchi Neuro: Grossly normal, moves all extremities Psych: Normal affect and thought content      I,Jordan Kelly,acting as a scribe for Dimas Chyle, MD.,have documented all relevant documentation on the behalf of Dimas Chyle,  MD,as directed by  Dimas Chyle, MD while in the presence of Dimas Chyle, MD.   I, Dimas Chyle, MD, have reviewed all documentation for this visit. The documentation on 05/25/21 for the exam, diagnosis, procedures, and orders are all accurate and complete.  Algis Greenhouse. Jerline Pain, MD 05/25/2021 2:32 PM

## 2021-05-25 NOTE — Assessment & Plan Note (Signed)
Database without red flags.  She is on Adderall 20 mg daily as needed.  Uses very sparingly.  No side effects.  Medications help with ability to stay focused and on task.  Will refill today.  Advised her to follow-up with her PCP soon.

## 2021-05-25 NOTE — Addendum Note (Signed)
Addended by: Betti Cruz on: 05/25/2021 03:08 PM   Modules accepted: Orders

## 2021-05-25 NOTE — Assessment & Plan Note (Signed)
Has done well with Sprintec in the past.  We will refill today.

## 2021-07-23 ENCOUNTER — Encounter: Payer: BC Managed Care – PPO | Admitting: Family Medicine

## 2021-07-23 ENCOUNTER — Ambulatory Visit (INDEPENDENT_AMBULATORY_CARE_PROVIDER_SITE_OTHER): Payer: BC Managed Care – PPO

## 2021-07-23 ENCOUNTER — Other Ambulatory Visit: Payer: Self-pay

## 2021-07-23 ENCOUNTER — Ambulatory Visit: Payer: BC Managed Care – PPO

## 2021-07-23 DIAGNOSIS — Z23 Encounter for immunization: Secondary | ICD-10-CM | POA: Diagnosis not present

## 2021-07-23 NOTE — Progress Notes (Signed)
Terri Clarke 50 year old female presents in office today for second shingles vaccine per Billey Chang, MD. Administered [facility] 0.5mg  IM right deltoid. Patient tolerated injection well

## 2021-07-28 ENCOUNTER — Ambulatory Visit: Payer: BC Managed Care – PPO

## 2021-09-21 ENCOUNTER — Encounter: Payer: Self-pay | Admitting: Gastroenterology

## 2021-10-05 ENCOUNTER — Ambulatory Visit (INDEPENDENT_AMBULATORY_CARE_PROVIDER_SITE_OTHER): Payer: BC Managed Care – PPO | Admitting: Family Medicine

## 2021-10-05 ENCOUNTER — Other Ambulatory Visit: Payer: Self-pay

## 2021-10-05 ENCOUNTER — Encounter: Payer: Self-pay | Admitting: Family Medicine

## 2021-10-05 ENCOUNTER — Other Ambulatory Visit (HOSPITAL_COMMUNITY)
Admission: RE | Admit: 2021-10-05 | Discharge: 2021-10-05 | Disposition: A | Discharge status: Home / Self Care | Payer: BC Managed Care – PPO | Source: Ambulatory Visit | Attending: Family Medicine | Admitting: Family Medicine

## 2021-10-05 VITALS — BP 115/76 | HR 52 | Temp 97.2°F | Ht 65.5 in | Wt 168.2 lb

## 2021-10-05 DIAGNOSIS — Z124 Encounter for screening for malignant neoplasm of cervix: Secondary | ICD-10-CM | POA: Diagnosis present

## 2021-10-05 DIAGNOSIS — Z1212 Encounter for screening for malignant neoplasm of rectum: Secondary | ICD-10-CM

## 2021-10-05 DIAGNOSIS — G5603 Carpal tunnel syndrome, bilateral upper limbs: Secondary | ICD-10-CM | POA: Diagnosis not present

## 2021-10-05 DIAGNOSIS — Z1231 Encounter for screening mammogram for malignant neoplasm of breast: Secondary | ICD-10-CM | POA: Diagnosis not present

## 2021-10-05 DIAGNOSIS — Z0000 Encounter for general adult medical examination without abnormal findings: Secondary | ICD-10-CM | POA: Diagnosis present

## 2021-10-05 DIAGNOSIS — N951 Menopausal and female climacteric states: Secondary | ICD-10-CM

## 2021-10-05 DIAGNOSIS — Z3041 Encounter for surveillance of contraceptive pills: Secondary | ICD-10-CM

## 2021-10-05 DIAGNOSIS — F900 Attention-deficit hyperactivity disorder, predominantly inattentive type: Secondary | ICD-10-CM

## 2021-10-05 DIAGNOSIS — Z1211 Encounter for screening for malignant neoplasm of colon: Secondary | ICD-10-CM

## 2021-10-05 LAB — COMPREHENSIVE METABOLIC PANEL
ALT: 28 U/L (ref 0–35)
AST: 21 U/L (ref 0–37)
Albumin: 4.5 g/dL (ref 3.5–5.2)
Alkaline Phosphatase: 68 U/L (ref 39–117)
BUN: 15 mg/dL (ref 6–23)
CO2: 28 mEq/L (ref 19–32)
Calcium: 10 mg/dL (ref 8.4–10.5)
Chloride: 101 mEq/L (ref 96–112)
Creatinine, Ser: 0.96 mg/dL (ref 0.40–1.20)
GFR: 67.97 mL/min (ref >60.00)
Glucose, Bld: 108 mg/dL — ABNORMAL HIGH (ref 70–99)
Potassium: 4 mEq/L (ref 3.5–5.1)
Sodium: 138 mEq/L (ref 135–145)
Total Bilirubin: 0.6 mg/dL (ref 0.2–1.2)
Total Protein: 7.3 g/dL (ref 6.0–8.3)

## 2021-10-05 LAB — CBC WITH DIFFERENTIAL/PLATELET
Basophils Absolute: 0 10*3/uL (ref 0.0–0.1)
Basophils Relative: 0.4 % (ref 0.0–3.0)
Eosinophils Absolute: 0.1 10*3/uL (ref 0.0–0.7)
Eosinophils Relative: 2.4 % (ref 0.0–5.0)
HCT: 40.5 % (ref 36.0–46.0)
Hemoglobin: 13.6 g/dL (ref 12.0–15.0)
Lymphocytes Relative: 20.2 % (ref 12.0–46.0)
Lymphs Abs: 1.2 10*3/uL (ref 0.7–4.0)
MCHC: 33.7 g/dL (ref 30.0–36.0)
MCV: 89.4 fl (ref 78.0–100.0)
Monocytes Absolute: 0.4 10*3/uL (ref 0.1–1.0)
Monocytes Relative: 6.9 % (ref 3.0–12.0)
Neutro Abs: 4.3 10*3/uL (ref 1.4–7.7)
Neutrophils Relative %: 70.1 % (ref 43.0–77.0)
Platelets: 237 10*3/uL (ref 150.0–400.0)
RBC: 4.53 Mil/uL (ref 3.87–5.11)
RDW: 13.3 % (ref 11.5–15.5)
WBC: 6.1 10*3/uL (ref 4.0–10.5)

## 2021-10-05 LAB — LIPID PANEL
Cholesterol: 192 mg/dL (ref 0–200)
HDL: 92.2 mg/dL (ref >39.00)
LDL Cholesterol: 91 mg/dL (ref 0–99)
NonHDL: 99.41
Total CHOL/HDL Ratio: 2
Triglycerides: 43 mg/dL (ref 0.0–149.0)
VLDL: 8.6 mg/dL (ref 0.0–40.0)

## 2021-10-05 LAB — TSH: TSH: 1.95 u[IU]/mL (ref 0.35–5.50)

## 2021-10-05 LAB — FOLLICLE STIMULATING HORMONE: FSH: 61.1 m[IU]/mL

## 2021-10-05 MED ORDER — AMPHETAMINE-DEXTROAMPHET ER 20 MG PO CP24: 20.0000 mg | ORAL_CAPSULE | Freq: Every day | ORAL | 0 refills | Status: DC | PRN

## 2021-10-05 NOTE — Progress Notes (Signed)
Subjective  Chief Complaint  Patient presents with   Annual Exam    Patient fasting today    Numbness    On both hands x 1 month     HPI: Terri Clarke is a 50 y.o. female who presents to [facility] [place] today for a Female Wellness Visit. She also has the concerns and/or needs as listed above in the chief complaint. These will be addressed in addition to the Health Maintenance Visit.   Wellness Visit: annual visit with health maintenance review and exam with Pap  Health maintenance: Patient has a GI appointment in January to get screened for colon cancer.  I believe this is with [facility].  She is due for a mammogram and Pap smear.  Continues to feel well.  However she is less active at work and does admit to weight gain.  She declines flu vaccination at this time.  She has had her shingles vaccinations.  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  Home life is good.  Chronic disease f/u and/or acute problem visit: (deemed necessary to be done in addition to the wellness visit): ADD: She continues on Adderall 10 mg nightly uses it as needed.  Her last prescription was from July and she just ran out.  Uses about once or twice weekly.  Uses it for days where she will be at the desk for doing office work most of the day.  She has physical activity, she tends to do fine.  No adverse effects.  Reviewed [facility] drug database.  It is appropriate. Menopausal hot flashes: She has been on oral contraceptives but currently is only using intermittently.  She takes them for 3 to 4 days and then stops for 3 or 4 days.  When she comes off them she does have a resurgence of hot flashes.  No bleeding. Complains of approximately 4-week history of bilateral hand numbness and tingling.  Noted mostly at night.  Has to shake hands to relieve symptoms.  No weakness.  Rare symptoms during the day.  She does use the computer a lot more now and  believes this is related.  No injuries.  No pain.  Assessment  1. Annual physical exam   2. Cervical cancer screening   3. Screening for colorectal cancer   4. Encounter for screening mammogram for breast cancer   5. Attention deficit hyperactivity disorder (ADHD), predominantly inattentive type   6. Oral contraceptive use   7. Menopausal hot flushes   8. Bilateral carpal tunnel syndrome      Plan  Female Wellness Visit: Age appropriate Health Maintenance and Prevention measures were discussed with patient. Included topics are cancer screening recommendations, ways to keep healthy (see AVS) including dietary and exercise recommendations, regular eye and dental care, use of seat belts, and avoidance of moderate alcohol use and tobacco use.  Pap smear with high-risk HPV testing done today.  Mammogram ordered.  Colonoscopy visit scheduled. BMI: discussed patient's BMI and encouraged positive lifestyle modifications to help get to or maintain a target BMI. HM needs and immunizations were addressed and ordered. See below for orders. See HM and immunization section for updates.  Declines flu vaccine today.  Shingrix vaccination up-to-date Routine labs and screening tests ordered including cmp, cbc and lipids where appropriate. Discussed recommendations regarding Vit D and calcium supplementation (see AVS)  Chronic disease management visit and/or acute problem visit: ADD: Well-controlled with rare Adderall  needed.  Refilled.  Adderall 10 mg daily as needed.  #90 showed last 6 months. Menopausal hot flashes: Counseling done.  She is a fairly good OCP candidate.  Await Pap smear results.  But we can try oral estrogens for HRT if needed.  She has failed transdermal's in the past.  Check FSH.  She has been off birth control for at least 1 week. Bilateral carpal tunnel: Education given.  Recommend wearing splints at night, 1 round of NSAIDs.  Follow-up if worsens.  See after visit summary  Follow up:  Return in about 6 months (around 04/05/2022) for follow up on ADD.  Orders Placed This Encounter  Procedures   MM DIGITAL SCREENING BILATERAL   CBC with Differential/Platelet   Comprehensive metabolic panel   Hepatitis C antibody   Lipid panel   TSH   FSH   Meds ordered this encounter  Medications   amphetamine-dextroamphetamine (ADDERALL XR) 20 MG 24 hr capsule    Sig: Take 1 capsule (20 mg total) by mouth daily as needed.    Dispense:  30 capsule    Refill:  0   amphetamine-dextroamphetamine (ADDERALL XR) 20 MG 24 hr capsule    Sig: Take 1 capsule (20 mg total) by mouth daily as needed.    Dispense:  30 capsule    Refill:  0   amphetamine-dextroamphetamine (ADDERALL XR) 20 MG 24 hr capsule    Sig: Take 1 capsule (20 mg total) by mouth daily as needed.    Dispense:  30 capsule    Refill:  0      Body mass index is 27.56 kg/m. Wt Readings from Last 3 Encounters:  10/05/21 168 lb 3.2 oz (76.3 kg)  05/25/21 168 lb 9.6 oz (76.5 kg)  06/18/20 162 lb 6.4 oz (73.7 kg)     Patient Active Problem List   Diagnosis Date Noted   Menopausal hot flushes 10/02/2019    Treated with OCPs; unresponsive to HRT patch. Will try to change in the next several years.     ADHD (attention deficit hyperactivity disorder) 01/31/2019   History of cold sores 07/11/2018   Health Maintenance  Topic Date Due   Hepatitis C Screening  Never done   COLONOSCOPY (Pts 45-29yrs Insurance coverage will need to be confirmed)  Never done   MAMMOGRAM  12/09/2020   PAP SMEAR-Modifier  09/27/2021   COVID-19 Vaccine (4 - Booster for [place] series) 10/21/2021 (Originally 12/31/2020)   INFLUENZA VACCINE  01/29/2022 (Originally 06/01/2021)   TETANUS/TDAP  10/21/2029   HIV Screening  Completed   Zoster Vaccines- Shingrix  Completed   Pneumococcal Vaccine 52-78 Years old  Aged Out   HPV VACCINES  Aged Out   Immunization History  Administered Date(s) Administered   Influenza,inj,Quad PF,6+ Mos 10/02/2019    PFIZER(Purple Top)SARS-COV-2 Vaccination 12/29/2019, 01/19/2020, 11/05/2020   Tdap 11/01/2008, 10/22/2019   Zoster Recombinat (Shingrix) 05/25/2021, 07/23/2021   We updated and reviewed the patient's past history in detail and it is documented below. Allergies: Patient has No Known Allergies. Past Medical History Patient  has a past medical history of ADD (attention deficit disorder) and Oral contraceptive use (10/02/2019). Past Surgical History Patient  has no past surgical history on file. Family History: Patient family history includes COPD in her mother; Diabetes in her maternal grandmother; Healthy in her brother and sister; Hypertension in her sister; Lung cancer in her father; Migraines in her mother; Stroke in her father. Social History:  Patient  reports that she has never  smoked. She has never used smokeless tobacco. She reports current alcohol use. She reports that she does not use drugs.  Review of Systems: Constitutional: negative for fever or malaise Ophthalmic: negative for photophobia, double vision or loss of vision Cardiovascular: negative for chest pain, dyspnea on exertion, or new LE swelling Respiratory: negative for SOB or persistent cough Gastrointestinal: negative for abdominal pain, change in bowel habits or melena Genitourinary: negative for dysuria or gross hematuria, no abnormal uterine bleeding or disharge Musculoskeletal: negative for new gait disturbance or muscular weakness Integumentary: negative for new or persistent rashes, no breast lumps Neurological: negative for TIA or stroke symptoms Psychiatric: negative for SI or delusions Allergic/Immunologic: negative for hives  Patient Care Team    Relationship Specialty Notifications Start End  Leamon Arnt, MD PCP - General Family Medicine  10/02/19     Objective  Vitals: BP 115/76   Pulse (!) 52   Temp (!) 97.2 F (36.2 C) (Temporal)   Ht 5' 5.5" (1.664 m)   Wt 168 lb 3.2 oz (76.3 kg)   SpO2  99%   BMI 27.56 kg/m  General:  Well developed, well nourished, no acute distress  Psych:  Alert and orientedx3,normal mood and affect HEENT:  Normocephalic, atraumatic, non-icteric sclera,  supple neck without adenopathy, mass or thyromegaly Cardiovascular:  Normal S1, S2, RRR without gallop, rub or murmur Respiratory:  Good breath sounds bilaterally, CTAB with normal respiratory effort Gastrointestinal: normal bowel sounds, soft, non-tender, no noted masses. No HSM MSK: no deformities, contusions. Joints are without erythema or swelling.  Skin:  Warm, no rashes or suspicious lesions noted Neurologic:    Mental status is normal. CN 2-11 are normal. Gross motor and sensory exams are normal. Normal gait. No tremor, positive Phalen's bilaterally.  No hypothenar atrophy Breast Exam: No mass, skin retraction or nipple discharge is appreciated in either breast. No axillary adenopathy. Fibrocystic changes are not noted Pelvic Exam: Normal external genitalia, no vulvar or vaginal lesions present. Clear cervix w/o CMT. Bimanual exam reveals a nontender fundus w/o masses, nl size. No adnexal masses present. No inguinal adenopathy. A PAP smear was performed.   Commons side effects, risks, benefits, and alternatives for medications and treatment plan prescribed today were discussed, and the patient expressed understanding of the given instructions. Patient is instructed to call or message via MyChart if he/she has any questions or concerns regarding our treatment plan. No barriers to understanding were identified. We discussed Red Flag symptoms and signs in detail. Patient expressed understanding regarding what to do in case of urgent or emergency type symptoms.  Medication list was reconciled, printed and provided to the patient in AVS. Patient instructions and summary information was reviewed with the patient as documented in the AVS. This note was prepared with assistance of Dragon voice recognition  software. Occasional wrong-word or sound-a-like substitutions may have occurred due to the inherent limitations of voice recognition software  This visit occurred during the SARS-CoV-2 public health emergency.  Safety protocols were in place, including screening questions prior to the visit, additional usage of staff PPE, and extensive cleaning of exam room while observing appropriate contact time as indicated for disinfecting solutions.

## 2021-10-05 NOTE — Patient Instructions (Addendum)
Please return in 6 months for ADD and hot flush follow up.  I will release your lab results to you on your MyChart account with further instructions. Please reply with any questions.  I will change your hormones once I get your lab work back.   Please schedule your mammogram.   If you have any questions or concerns, please don't hesitate to send me a message via MyChart or call the office at [phone]. Thank you for visiting with [place] today! It's our pleasure caring for you.   Carpal Tunnel Syndrome Carpal tunnel syndrome is a condition that causes pain, numbness, and weakness in your hand and fingers. The carpal tunnel is a narrow area located on the palm side of your wrist. Repeated wrist motion or certain diseases may cause swelling within the tunnel. This swelling pinches the main nerve in the wrist. The main nerve in the wrist is called the median nerve. What are the causes? This condition may be caused by: Repeated and forceful wrist and hand motions. Wrist injuries. Arthritis. A cyst or tumor in the carpal tunnel. Fluid buildup during pregnancy. Use of tools that vibrate. Sometimes the cause of this condition is not known. What increases the risk? The following factors may make you more likely to develop this condition: Having a job that requires you to repeatedly or forcefully move your wrist or hand or requires you to use tools that vibrate. This may include jobs that involve using computers, working on an [employer], or working with [place] such as Pension scheme manager. Being a woman. Having certain conditions, such as: Diabetes. Obesity. An underactive thyroid (hypothyroidism). Kidney failure. Rheumatoid arthritis. What are the signs or symptoms? Symptoms of this condition include: A tingling feeling in your fingers, especially in your thumb, index, and middle fingers. Tingling or numbness in your hand. An aching feeling in your entire arm, especially when your wrist  and elbow are bent for a long time. Wrist pain that goes up your arm to your shoulder. Pain that goes down into your palm or fingers. A weak feeling in your hands. You may have trouble grabbing and holding items. Your symptoms may feel worse during the night. How is this diagnosed? This condition is diagnosed with a medical history and physical exam. You may also have tests, including: Electromyogram (EMG). This test measures electrical signals sent by your nerves into the muscles. Nerve conduction study. This test measures how well electrical signals pass through your nerves. Imaging tests, such as X-rays, ultrasound, and MRI. These tests check for possible causes of your condition. How is this treated? This condition may be treated with: Lifestyle changes. It is important to stop or change the activity that caused your condition. Doing exercise and activities to strengthen and stretch your muscles and tendons (physical therapy). Making lifestyle changes to help with your condition and learning how to do your daily activities safely (occupational therapy). Medicines for pain and inflammation. This may include medicine that is injected into your wrist. A wrist splint or brace. Surgery. Follow these instructions at home: If you have a splint or brace: Wear the splint or brace as told by your health care provider. Remove it only as told by your health care provider. Loosen the splint or brace if your fingers tingle, become numb, or turn cold and blue. Keep the splint or brace clean. If the splint or brace is not waterproof: Do not let it get wet. Cover it with a watertight covering when you  take a bath or shower. Managing pain, stiffness, and swelling If directed, put ice on the painful area. To do this: If you have a removeable splint or brace, remove it as told by your health care provider. Put ice in a plastic bag. Place a towel between your skin and the bag or between the splint or  brace and the bag. Leave the ice on for 20 minutes, 2-3 times a day. Do not fall asleep with the cold pack on your skin. Remove the ice if your skin turns bright red. This is very important. If you cannot feel pain, heat, or cold, you have a greater risk of damage to the area. Move your fingers often to reduce stiffness and swelling. General instructions Take over-the-counter and prescription medicines only as told by your health care provider. Rest your wrist and hand from any activity that may be causing your pain. If your condition is work related, talk with your employer about changes that can be made, such as getting a wrist pad to use while typing. Do any exercises as told by your health care provider, physical therapist, or occupational therapist. Keep all follow-up visits. This is important. Contact a health care provider if: You have new symptoms. Your pain is not controlled with medicines. Your symptoms get worse. Get help right away if: You have severe numbness or tingling in your wrist or hand. Summary Carpal tunnel syndrome is a condition that causes pain, numbness, and weakness in your hand and fingers. It is usually caused by repeated wrist motions. Lifestyle changes and medicines are used to treat carpal tunnel syndrome. Surgery may be recommended. Follow your health care provider's instructions about wearing a splint, resting from activity, keeping follow-up visits, and calling for help. This information is not intended to replace advice given to you by your health care provider. Make sure you discuss any questions you have with your health care provider. Document Revised: 02/28/2020 Document Reviewed: 02/28/2020 Elsevier Patient Education  [place].  Please do these things to maintain good health!  Exercise at least 30-45 minutes a day,  4-5 days a week.  Eat a low-fat diet with lots of fruits and vegetables, up to 7-9 servings per day. Drink plenty of water  daily. Try to drink 8 8oz glasses per day. Seatbelts can save your life. Always wear your seatbelt. Place Smoke Detectors on every level of your home and check batteries every year. Schedule an appointment with an eye doctor for an eye exam every 1-2 years Safe sex - use condoms to protect yourself from STDs if you could be exposed to these types of infections. Use birth control if you do not want to become pregnant and are sexually active. Avoid heavy alcohol use. If you drink, keep it to less than 2 drinks/day and not every day. [place].  Choose someone you trust that could speak for you if you became unable to speak for yourself. Depression is common in our stressful world.If you're feeling down or losing interest in things you normally enjoy, please come in for a visit. If anyone is threatening or hurting you, please get help. Physical or Emotional Violence is never OK.

## 2021-10-06 LAB — HEPATITIS C ANTIBODY
Hepatitis C Ab: NONREACTIVE
SIGNAL TO CUT-OFF: 0.02 (ref <1.00)

## 2021-10-08 LAB — CYTOLOGY - PAP
Comment: NEGATIVE
Diagnosis: NEGATIVE
High risk HPV: NEGATIVE

## 2021-10-12 MED ORDER — NORETHINDRONE-ETH ESTRADIOL 1-5 MG-MCG PO TABS: 1.0000 | ORAL_TABLET | Freq: Every day | ORAL | 11 refills | Status: DC

## 2021-10-12 NOTE — Addendum Note (Signed)
Addended by: Billey Chang on: 10/12/2021 12:38 PM   Modules accepted: Orders

## 2021-11-09 ENCOUNTER — Ambulatory Visit (AMBULATORY_SURGERY_CENTER): Payer: BC Managed Care – PPO | Admitting: *Deleted

## 2021-11-09 ENCOUNTER — Other Ambulatory Visit: Payer: Self-pay

## 2021-11-09 VITALS — Ht 66.0 in | Wt 168.0 lb

## 2021-11-09 DIAGNOSIS — Z1211 Encounter for screening for malignant neoplasm of colon: Secondary | ICD-10-CM

## 2021-11-09 MED ORDER — NA SULFATE-K SULFATE-MG SULF 17.5-3.13-1.6 GM/177ML PO SOLN: 1.0000 | Freq: Once | ORAL | 0 refills | Status: AC

## 2021-11-09 NOTE — Progress Notes (Signed)

## 2021-11-10 ENCOUNTER — Ambulatory Visit
Admission: RE | Admit: 2021-11-10 | Discharge: 2021-11-10 | Disposition: A | Discharge status: Home / Self Care | Payer: BC Managed Care – PPO | Source: Ambulatory Visit | Attending: Family Medicine | Admitting: Family Medicine

## 2021-11-10 DIAGNOSIS — Z1231 Encounter for screening mammogram for malignant neoplasm of breast: Secondary | ICD-10-CM

## 2021-11-10 DIAGNOSIS — Z0000 Encounter for general adult medical examination without abnormal findings: Secondary | ICD-10-CM

## 2021-11-17 ENCOUNTER — Encounter: Payer: Self-pay | Admitting: Gastroenterology

## 2021-11-18 ENCOUNTER — Telehealth: Payer: Self-pay | Admitting: Gastroenterology

## 2021-11-18 NOTE — Telephone Encounter (Signed)
Patient called stating that pharmacy did not have prep mediation. Seeking advice, please advise.

## 2021-11-18 NOTE — Telephone Encounter (Signed)
Per pharmacy they have suprep available and filled rx for patient-patient notified.

## 2021-11-21 ENCOUNTER — Encounter: Payer: Self-pay | Admitting: Certified Registered Nurse Anesthetist

## 2021-11-23 ENCOUNTER — Encounter: Payer: Self-pay | Admitting: Gastroenterology

## 2021-11-23 ENCOUNTER — Other Ambulatory Visit: Payer: Self-pay

## 2021-11-23 ENCOUNTER — Ambulatory Visit (AMBULATORY_SURGERY_CENTER): Payer: BC Managed Care – PPO | Admitting: Gastroenterology

## 2021-11-23 VITALS — BP 130/81 | HR 50 | Temp 97.1°F | Resp 14 | Ht 65.5 in | Wt 168.0 lb

## 2021-11-23 DIAGNOSIS — Z1211 Encounter for screening for malignant neoplasm of colon: Secondary | ICD-10-CM

## 2021-11-23 DIAGNOSIS — K635 Polyp of colon: Secondary | ICD-10-CM | POA: Diagnosis not present

## 2021-11-23 DIAGNOSIS — D123 Benign neoplasm of transverse colon: Secondary | ICD-10-CM

## 2021-11-23 MED ORDER — SODIUM CHLORIDE 0.9 % IV SOLN: 500.0000 mL | Freq: Once | INTRAVENOUS | Status: DC

## 2021-11-23 NOTE — Op Note (Signed)
[facility] Patient Name: Terri Clarke Procedure Date: 11/23/2021 7:53 AM MRN: [id number] Endoscopist: Remo Lipps P. Havery Moros , MD Age: 51 Referring MD:  Date of Birth: [birth year] Gender: Female Account #: [id number] Procedure:                Colonoscopy Indications:              Screening for colorectal malignant neoplasm, This                            is the patient's first colonoscopy Medicines:                Monitored Anesthesia Care Procedure:                Pre-Anesthesia Assessment:                           - Prior to the procedure, a History and Physical                            was performed, and patient medications and                            allergies were reviewed. The patient's tolerance of                            previous anesthesia was also reviewed. The risks                            and benefits of the procedure and the sedation                            options and risks were discussed with the patient.                            All questions were answered, and informed consent                            was obtained. Prior Anticoagulants: The patient has                            taken no previous anticoagulant or antiplatelet                            agents. ASA Grade Assessment: II - A patient with                            mild systemic disease. After reviewing the risks                            and benefits, the patient was deemed in                            satisfactory condition to undergo the procedure.  After obtaining informed consent, the colonoscope                            was passed under direct vision. Throughout the                            procedure, the patient's blood pressure, pulse, and                            oxygen saturations were monitored continuously. The                            PCF-HQ190L Colonoscope was introduced through the                            anus and advanced  to the the cecum, identified by                            appendiceal orifice and ileocecal valve. The                            colonoscopy was performed without difficulty. The                            patient tolerated the procedure well. The quality                            of the bowel preparation was good. The ileocecal                            valve, appendiceal orifice, and rectum were                            photographed. Scope In: 8:04:06 AM Scope Out: 8:17:33 AM Scope Withdrawal Time: 0 hours 11 minutes 27 seconds  Total Procedure Duration: 0 hours 13 minutes 27 seconds  Findings:                 The perianal and digital rectal examinations were                            normal.                           A 3 to 4 mm polyp was found in the transverse                            colon. The polyp was sessile. The polyp was removed                            with a cold snare. Resection and retrieval were                            complete.  A few small-mouthed diverticula were found in the                            transverse colon.                           The exam was otherwise without abnormality. Complications:            No immediate complications. Estimated blood loss:                            Minimal. Estimated Blood Loss:     Estimated blood loss was minimal. Impression:               - One 3 to 4 mm polyp in the transverse colon,                            removed with a cold snare. Resected and retrieved.                           - Diverticulosis in the transverse colon.                           - The examination was otherwise normal. Recommendation:           - Patient has a contact number available for                            emergencies. The signs and symptoms of potential                            delayed complications were discussed with the                            patient. Return to normal activities tomorrow.                             Written discharge instructions were provided to the                            patient.                           - Resume previous diet.                           - Continue present medications.                           - Await pathology results. Remo Lipps P. Urijah Raynor, MD 11/23/2021 8:21:50 AM This report has been signed electronically.

## 2021-11-23 NOTE — Patient Instructions (Signed)
Discharge instructions given. Handouts on polyps and Diverticulosis. Resume previous medications. YOU HAD AN ENDOSCOPIC PROCEDURE TODAY AT [facility]:   Refer to the procedure report that was given to you for any specific questions about what was found during the examination.  If the procedure report does not answer your questions, please call your gastroenterologist to clarify.  If you requested that your care partner not be given the details of your procedure findings, then the procedure report has been included in a sealed envelope for you to review at your convenience later.  YOU SHOULD EXPECT: Some feelings of bloating in the abdomen. Passage of more gas than usual.  Walking can help get rid of the air that was put into your GI tract during the procedure and reduce the bloating. If you had a lower endoscopy (such as a colonoscopy or flexible sigmoidoscopy) you may notice spotting of blood in your stool or on the toilet paper. If you underwent a bowel prep for your procedure, you may not have a normal bowel movement for a few days.  Please Note:  You might notice some irritation and congestion in your nose or some drainage.  This is from the oxygen used during your procedure.  There is no need for concern and it should clear up in a day or so.  SYMPTOMS TO REPORT IMMEDIATELY:  Following lower endoscopy (colonoscopy or flexible sigmoidoscopy):  Excessive amounts of blood in the stool  Significant tenderness or worsening of abdominal pains  Swelling of the abdomen that is new, acute  Fever of 100F or higher   For urgent or emergent issues, a gastroenterologist can be reached at any hour by calling [phone]. Do not use MyChart messaging for urgent concerns.    DIET:  We do recommend a small meal at first, but then you may proceed to your regular diet.  Drink plenty of fluids but you should avoid alcoholic beverages for 24 hours.  ACTIVITY:  You should plan to take it  easy for the rest of today and you should NOT DRIVE or use heavy machinery until tomorrow (because of the sedation medicines used during the test).    FOLLOW UP: Our staff will call the number listed on your records 48-72 hours following your procedure to check on you and address any questions or concerns that you may have regarding the information given to you following your procedure. If we do not reach you, we will leave a message.  We will attempt to reach you two times.  During this call, we will ask if you have developed any symptoms of COVID 19. If you develop any symptoms (ie: fever, flu-like symptoms, shortness of breath, cough etc.) before then, please call [phone].  If you test positive for Covid 19 in the 2 weeks post procedure, please call and report this information to [place].    If any biopsies were taken you will be contacted by phone or by letter within the next 1-3 weeks.  Please call us at [phone] if you have not heard about the biopsies in 3 weeks.    SIGNATURES/CONFIDENTIALITY: You and/or your care partner have signed paperwork which will be entered into your electronic medical record.  These signatures attest to the fact that that the information above on your After Visit Summary has been reviewed and is understood.  Full responsibility of the confidentiality of this discharge information lies with you and/or your care-partner.

## 2021-11-23 NOTE — Progress Notes (Signed)
VS-CW  Pt's states no medical or surgical changes since previsit or office visit.  

## 2021-11-23 NOTE — Progress Notes (Signed)
[facility] History and Physical   Primary Care Physician:  Leamon Arnt, MD   Reason for Procedure:   Colon cancer screening  Plan:    colonoscopy     HPI: Terri Clarke is a 51 y.o. female  here for colonoscopy screening - first time exam. Patient denies any bowel symptoms at this time. No family history of colon cancer known. Otherwise feels well without any cardiopulmonary symptoms.    Past Medical History:  Diagnosis Date   ADD (attention deficit disorder)    Oral contraceptive use 10/02/2019    Past Surgical History:  Procedure Laterality Date   WISDOM TOOTH EXTRACTION      Prior to Admission medications   Medication Sig Start Date End Date Taking? Authorizing Provider  BLACK COHOSH PO Take by mouth. TAKE ONE DAILY   Yes [provider]  Cyanocobalamin (VITAMIN B-12 PO) Take by mouth. TAKE 2 GUMMIES DAILY   Yes [provider]  Multiple Vitamins-Minerals (WOMENS MULTIVITAMIN PO) Take by mouth.   Yes [provider]  Nutritional Supplements (ESTROVEN PO) Take by mouth. TAKE ONE TABLET DAILY   Yes [provider]  Turmeric (QC TUMERIC COMPLEX PO) Take by mouth. TAKE 2 GUMMIES DAILY   Yes [provider]  amphetamine-dextroamphetamine (ADDERALL XR) 20 MG 24 hr capsule Take 1 capsule (20 mg total) by mouth daily as needed. Patient not taking: Reported on 11/09/2021 10/05/21   Leamon Arnt, MD  amphetamine-dextroamphetamine (ADDERALL XR) 20 MG 24 hr capsule Take 1 capsule (20 mg total) by mouth daily as needed. Patient not taking: Reported on 11/09/2021 11/04/21   Leamon Arnt, MD  amphetamine-dextroamphetamine (ADDERALL XR) 20 MG 24 hr capsule Take 1 capsule (20 mg total) by mouth daily as needed. Patient not taking: Reported on 11/09/2021 12/04/21   Leamon Arnt, MD  norgestimate-ethinyl estradiol (ORTHO-CYCLEN) 0.25-35 MG-MCG tablet Take 1 tablet by mouth daily. Patient not taking: Reported on 11/23/2021 11/10/21    [provider]    Current Outpatient Medications  Medication Sig Dispense Refill   BLACK COHOSH PO Take by mouth. TAKE ONE DAILY     Cyanocobalamin (VITAMIN B-12 PO) Take by mouth. TAKE 2 GUMMIES DAILY     Multiple Vitamins-Minerals (WOMENS MULTIVITAMIN PO) Take by mouth.     Nutritional Supplements (ESTROVEN PO) Take by mouth. TAKE ONE TABLET DAILY     Turmeric (QC TUMERIC COMPLEX PO) Take by mouth. TAKE 2 GUMMIES DAILY     amphetamine-dextroamphetamine (ADDERALL XR) 20 MG 24 hr capsule Take 1 capsule (20 mg total) by mouth daily as needed. (Patient not taking: Reported on 11/09/2021) 30 capsule 0   amphetamine-dextroamphetamine (ADDERALL XR) 20 MG 24 hr capsule Take 1 capsule (20 mg total) by mouth daily as needed. (Patient not taking: Reported on 11/09/2021) 30 capsule 0   [START ON 12/04/2021] amphetamine-dextroamphetamine (ADDERALL XR) 20 MG 24 hr capsule Take 1 capsule (20 mg total) by mouth daily as needed. (Patient not taking: Reported on 11/09/2021) 30 capsule 0   norgestimate-ethinyl estradiol (ORTHO-CYCLEN) 0.25-35 MG-MCG tablet Take 1 tablet by mouth daily. (Patient not taking: Reported on 11/23/2021)     Current Facility-Administered Medications  Medication Dose Route Frequency Provider Last Rate Last Admin   0.9 %  sodium chloride infusion  500 mL Intravenous Once Kyndal Gloster, Carlota Raspberry, MD        Allergies as of 11/23/2021   (No Known Allergies)    Family History  Problem Relation Age of Onset  Migraines Mother    COPD Mother    Colon polyps Father    Stroke Father    Lung cancer Father    Hypertension Sister    Healthy Sister    Healthy Brother    Diabetes Maternal Grandmother    Colon cancer Neg Hx    Breast cancer Neg Hx    Esophageal cancer Neg Hx    Rectal cancer Neg Hx    Stomach cancer Neg Hx     Social History   Socioeconomic History   Marital status: Soil scientist    Spouse name: Not on file   Number of children: 0   Years of education: Not  on file   Highest education level: Not on file  Occupational History   Occupation: Journalist, newspaper [employer]    Employer: [employer]  Tobacco Use   Smoking status: Never   Smokeless tobacco: Never  Vaping Use   Vaping Use: Never used  Substance and Sexual Activity   Alcohol use: Yes    Comment: occasional   Drug use: No   Sexual activity: Yes    Birth control/protection: None    Comment: same sex partner  Other Topics Concern   Not on file  Social History Narrative   Originally from [place]; moved to [place] age 31; identical twin sister (ex-premies). Female partner, long term. No children. Non-smoker.    Social Determinants of Health   Financial Resource Strain: Not on file  Food Insecurity: Not on file  Transportation Needs: Not on file  Physical Activity: Not on file  Stress: Not on file  Social Connections: Not on file  Intimate Partner Violence: Not on file    Review of Systems: All other review of systems negative except as mentioned in the HPI.  Physical Exam: Vital signs BP (!) 158/97    Pulse (!) 54    Temp (!) 97.1 F (36.2 C) (Temporal)    Resp 12    Ht 5' 5.5" (1.664 m)    Wt 168 lb (76.2 kg)    SpO2 100%    BMI 27.53 kg/m   General:   Alert,  Well-developed, pleasant and cooperative in NAD Lungs:  Clear throughout to auscultation.   Heart:  Regular rate and rhythm Abdomen:  Soft, nontender and nondistended.   Neuro/Psych:  Alert and cooperative. Normal mood and affect. A and O x 3  Jolly Mango, MD [facility]

## 2021-11-23 NOTE — Progress Notes (Signed)
Report given to PACU, vss 

## 2021-11-25 ENCOUNTER — Telehealth: Payer: Self-pay | Admitting: *Deleted

## 2021-11-25 NOTE — Telephone Encounter (Signed)
°  Follow up Call-  Call back number 11/23/2021  Post procedure Call Back phone  # [phone]  Permission to leave phone message Yes  Some recent data might be hidden     Patient questions: Message left to call if necessary.

## 2021-12-31 DIAGNOSIS — S46011A Strain of muscle(s) and tendon(s) of the rotator cuff of right shoulder, initial encounter: Secondary | ICD-10-CM

## 2021-12-31 HISTORY — DX: Strain of muscle(s) and tendon(s) of the rotator cuff of right shoulder, initial encounter: S46.011A

## 2022-01-10 IMAGING — MG DIGITAL SCREENING BILAT W/ TOMO W/ CAD
8 series · 9 of 24 positions shown · non-contrast
Comparison: None.

CLINICAL DATA: Screening.

EXAM:
DIGITAL SCREENING BILATERAL MAMMOGRAM WITH TOMO AND CAD

[R MLO synth-2D]
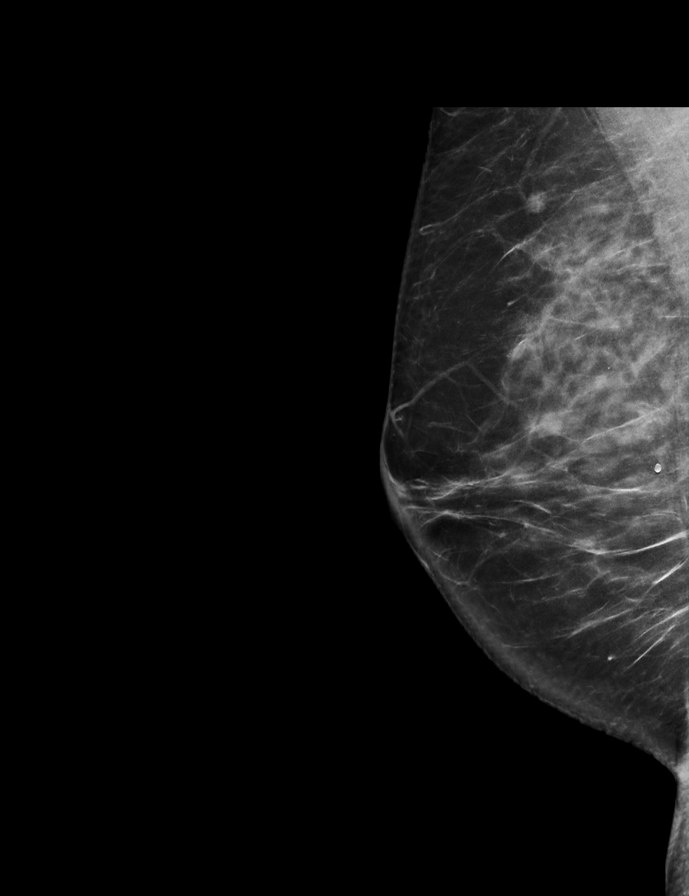

[R CC synth-2D]
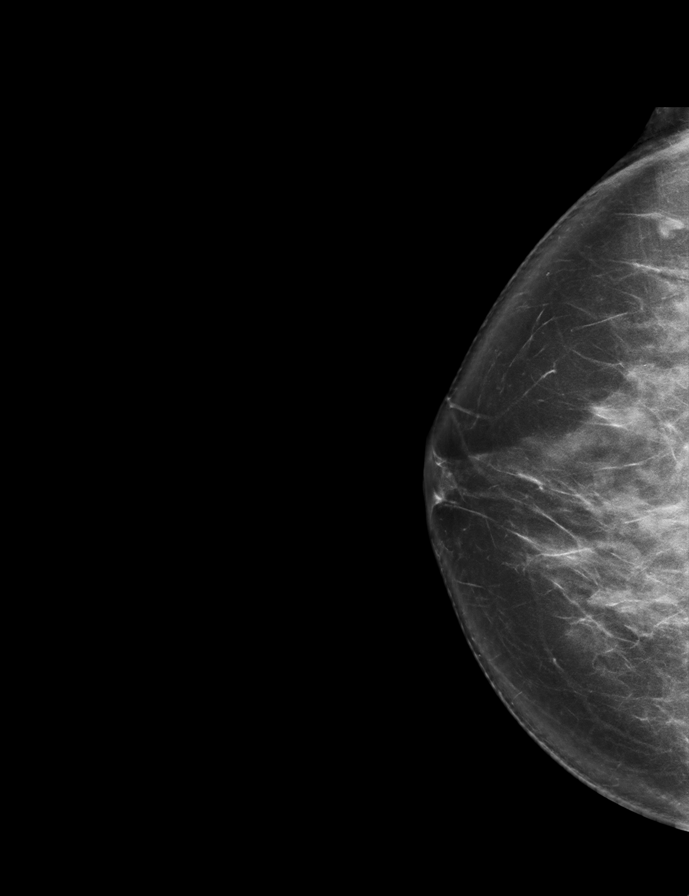

[L CC synth-2D]
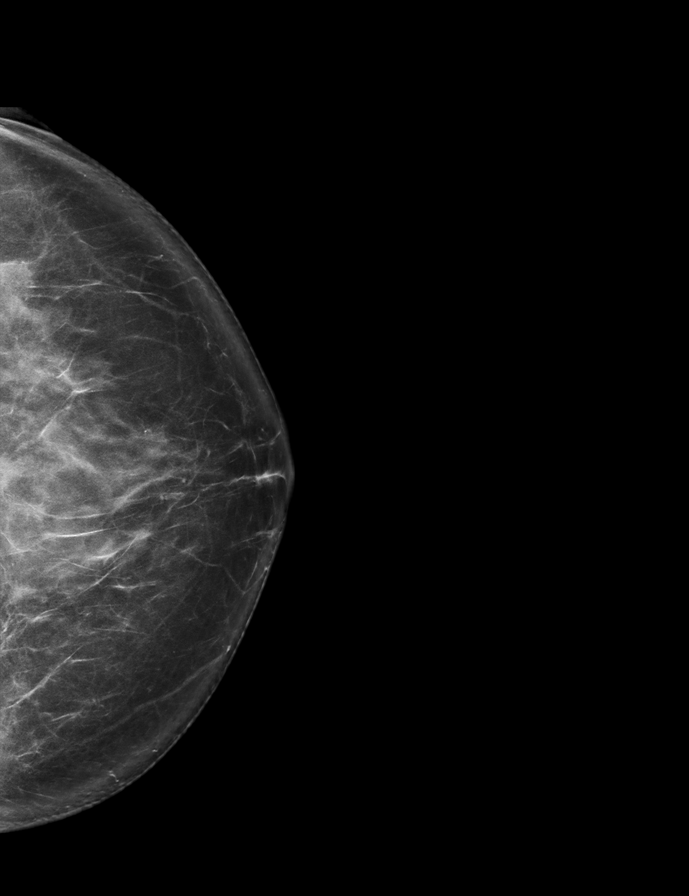

[L MLO synth-2D]
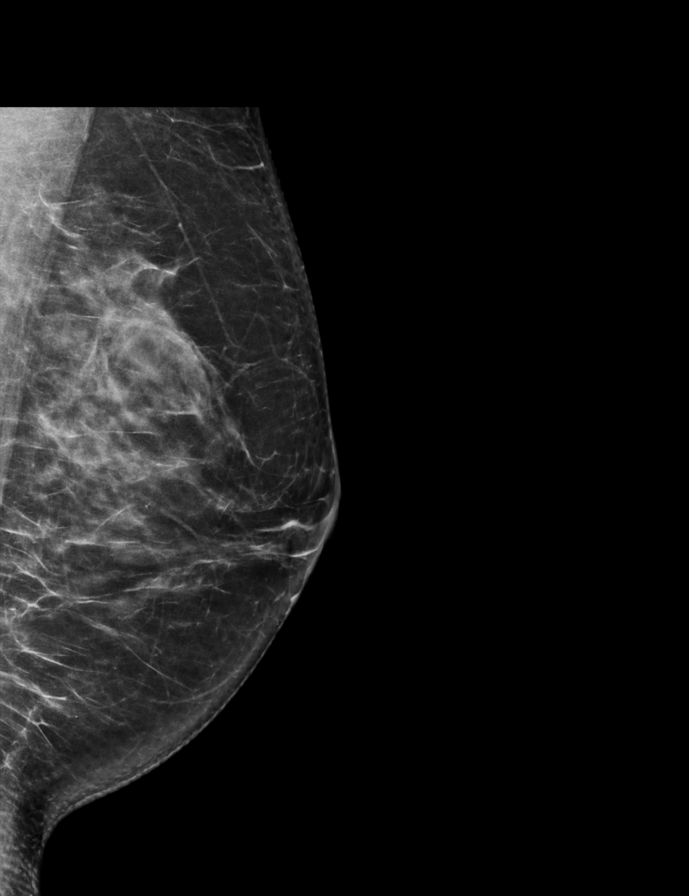

[L MLO tomo · 2 of 70 frames shown]
[frame 23/70]
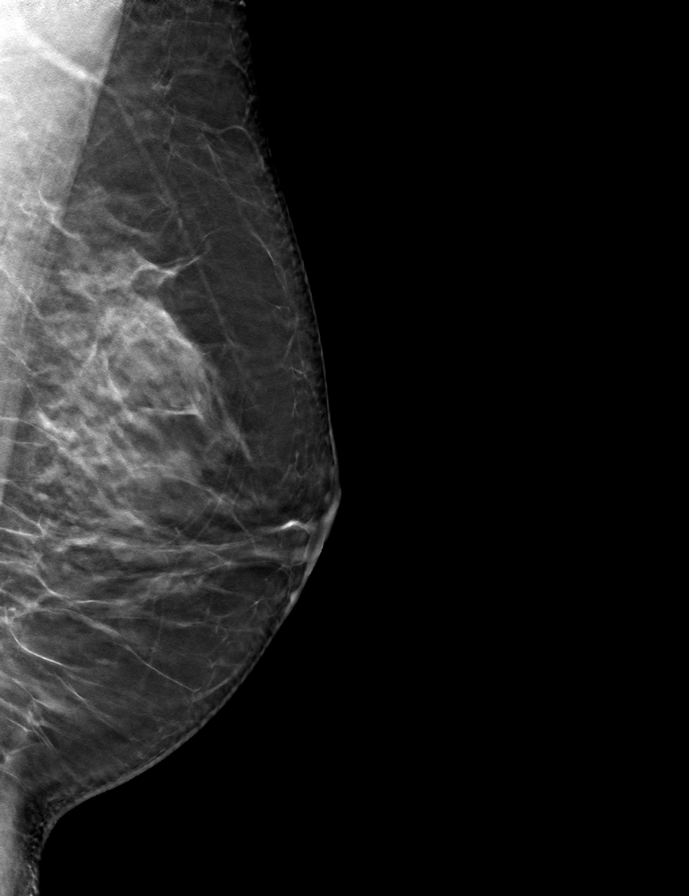
[frame 35/70]
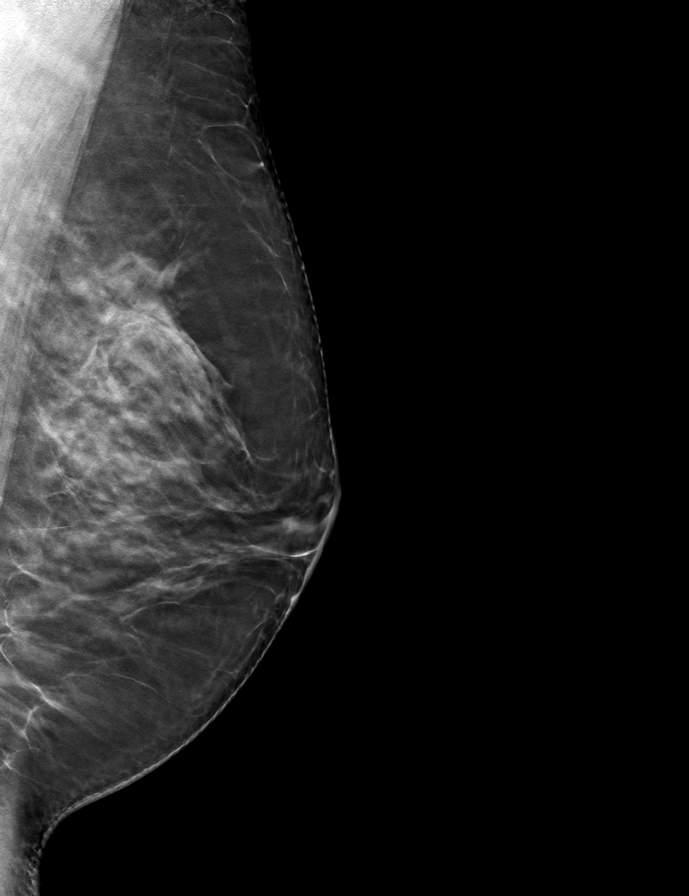

[R MLO tomo · tomo slice 37/74.0]
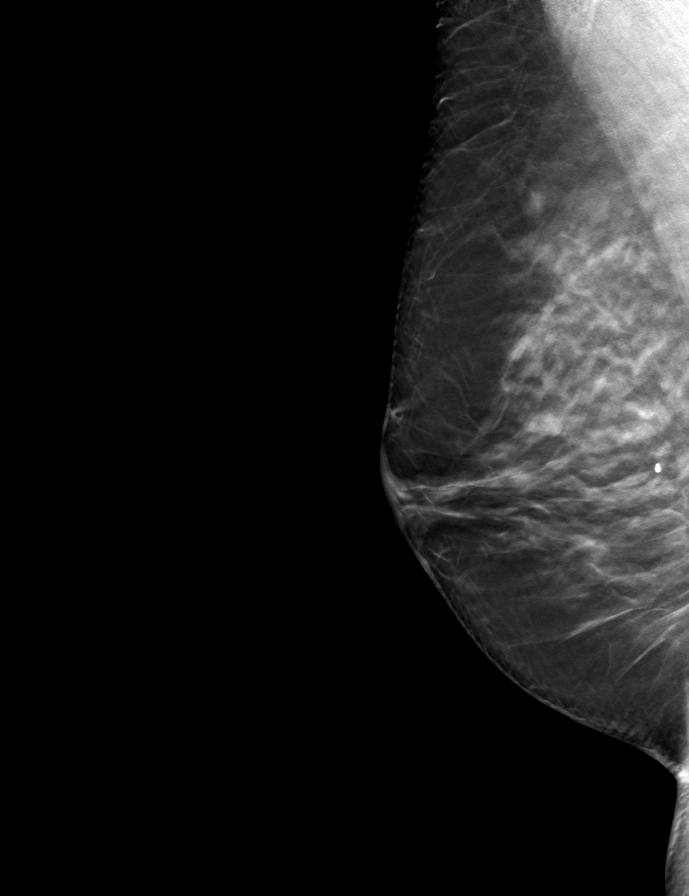

[L CC tomo · tomo slice 39/78.0]
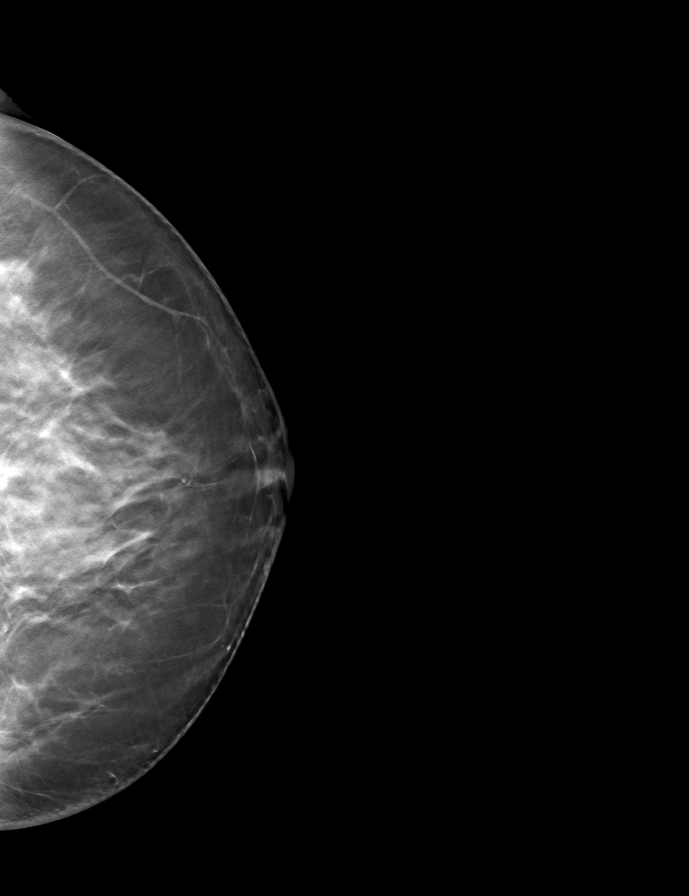

[R CC tomo · tomo slice 41/81.0]
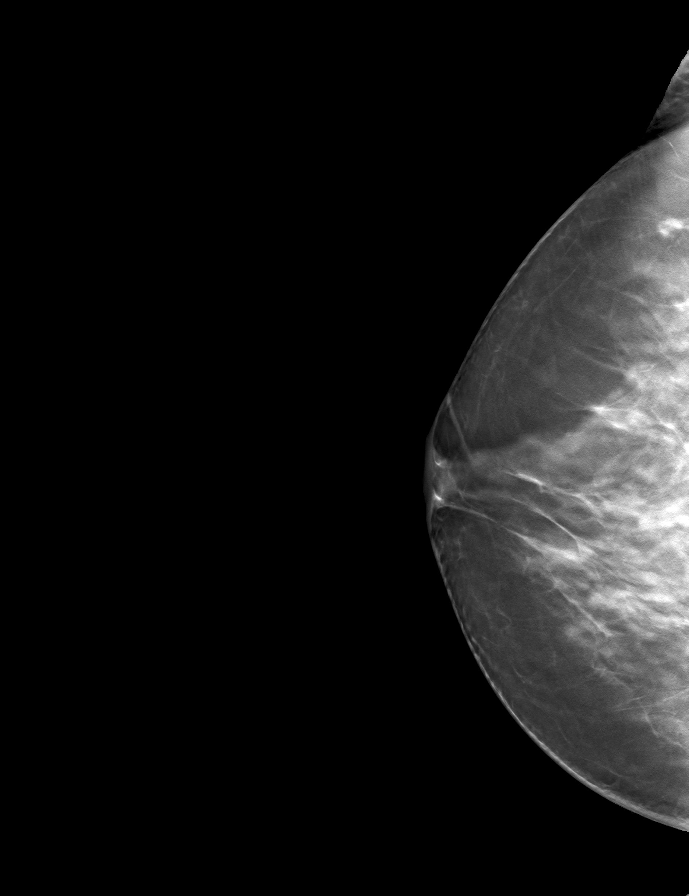

[9 of 24 positions shown; findings below may reference images not displayed]

ACR Breast Density Category c: The breast tissue is heterogeneously
dense, which may obscure small masses
FINDINGS: There are no findings suspicious for malignancy. Images were
processed with CAD.
IMPRESSION: No mammographic evidence of malignancy. A result letter of this
screening mammogram will be mailed directly to the patient.

RECOMMENDATION:
Screening mammogram in one year. (Code:EM-2-IHY)

BI-RADS CATEGORY  1: Negative.

## 2022-03-15 DIAGNOSIS — M24611 Ankylosis, right shoulder: Secondary | ICD-10-CM | POA: Insufficient documentation

## 2022-05-02 ENCOUNTER — Other Ambulatory Visit: Payer: Self-pay | Admitting: Family Medicine

## 2022-05-03 NOTE — Telephone Encounter (Signed)
Last refill done by historical provider  Last visit on 10/05/2021

## 2022-06-09 ENCOUNTER — Encounter (INDEPENDENT_AMBULATORY_CARE_PROVIDER_SITE_OTHER): Payer: Self-pay

## 2022-07-26 ENCOUNTER — Encounter: Payer: Self-pay | Admitting: *Deleted

## 2022-10-14 ENCOUNTER — Encounter: Payer: Self-pay | Admitting: *Deleted

## 2023-07-01 ENCOUNTER — Ambulatory Visit: Payer: BC Managed Care – PPO | Admitting: Family Medicine

## 2023-07-01 ENCOUNTER — Encounter: Payer: Self-pay | Admitting: Family Medicine

## 2023-07-01 VITALS — BP 110/70 | HR 96 | Temp 97.6°F | Ht 65.5 in | Wt 159.0 lb

## 2023-07-01 DIAGNOSIS — D171 Benign lipomatous neoplasm of skin and subcutaneous tissue of trunk: Secondary | ICD-10-CM | POA: Diagnosis not present

## 2023-07-01 NOTE — Progress Notes (Signed)
Subjective  CC:  Chief Complaint  Patient presents with   Mass    Pt stated that she has a lump on her back. Pt stated that a massage theriest noticed a lump on her back about 4 mos ago and wanted to get it check out     HPI: Terri Clarke is a 52 y.o. female who presents to the office today to address the problems listed above in the chief complaint. 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  At any rate, a physical therapist noticed a small lump on her left upper back about 4 months ago.  Patient did not think anything of it.  However while lying prone by the pool this weekend, her partner noticed the small lump that was red.  She shows me a picture.  Patient has not had any symptoms.  She cannot see it or feel it because of its location.  Assessment  1. Lipoma of torso      Plan  Mass upper trunk: Suspect lipoma.  Approximately 1 cm mobile soft nontender just posterior and inferior to the left axilla.  Will monitor and recheck again in October when she is here for her complete physical.  Imaging will be ordered if needed if things are changing.  Patient understands and agrees  Follow up: As scheduled for complete physical 08/17/2023  No orders of the defined types were placed in this encounter.  No orders of the defined types were placed in this encounter.     I reviewed the patients updated PMH, FH, and SocHx.    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   amphetamine-dextroamphetamine (ADDERALL XR) 20 MG 24 hr capsule Take 1 capsule (20 mg total) by mouth daily as needed.   amphetamine-dextroamphetamine (ADDERALL XR) 20 MG 24 hr capsule Take 1 capsule (20 mg total) by mouth daily as needed.   BLACK COHOSH PO Take by mouth. TAKE ONE DAILY   Cyanocobalamin (VITAMIN B-12 PO) Take by mouth. TAKE 2 GUMMIES DAILY   Multiple Vitamins-Minerals (WOMENS MULTIVITAMIN PO) Take by mouth.   norgestimate-ethinyl estradiol (ORTHO-CYCLEN) 0.25-35 MG-MCG tablet Take 1 tablet by mouth daily.   Nutritional Supplements (ESTROVEN PO) Take by mouth. TAKE ONE TABLET DAILY   Turmeric (QC TUMERIC COMPLEX PO) Take by mouth. TAKE 2 GUMMIES DAILY    Allergies: Patient has No Known Allergies. Family History: Patient family history includes COPD in her mother; Colon polyps in her father; Diabetes in her maternal grandmother; Healthy in her brother and sister; Hypertension in her sister; Lung cancer in her father; Migraines in her mother; Stroke in her father. Social History:  Patient  reports that she has never smoked. She has never used smokeless tobacco. She reports current alcohol use. She reports that she does not use drugs.  Review of Systems: Constitutional: Negative for fever malaise or anorexia Cardiovascular: negative for chest pain  Respiratory: negative for SOB or persistent cough Gastrointestinal: negative for abdominal pain  Objective  Vitals: BP 110/70   Pulse 96   Temp 97.6 F (36.4 C)   Ht 5' 5.5" (1.664 m)   Wt 159 lb (72.1 kg)   SpO2 98%   BMI 26.06 kg/m  General: no acute distress , A&Ox3 1 cm mobile nontender mass left upper lateral back, posterior and inferior to left axilla without erythema  Commons side effects, risks, benefits, and alternatives for medications and treatment plan prescribed today were discussed, and the patient expressed understanding of the given  instructions. Patient is instructed to call or message via MyChart if he/she has any questions or concerns regarding our treatment plan. No barriers to understanding were identified. We discussed Red Flag symptoms and signs in detail. Patient expressed understanding regarding what to do in case of urgent or emergency type symptoms.  Medication list was reconciled, printed and provided to the patient in AVS. Patient instructions and summary information was reviewed with the patient as documented in the AVS. This note was prepared with assistance of Dragon voice recognition software. Occasional wrong-word or sound-a-like substitutions may have occurred due to the inherent limitations of voice recognition software

## 2023-08-17 ENCOUNTER — Encounter: Payer: Self-pay | Admitting: Family Medicine

## 2023-08-17 ENCOUNTER — Ambulatory Visit: Payer: BC Managed Care – PPO | Admitting: Family Medicine

## 2023-08-17 VITALS — BP 110/80 | HR 55 | Temp 97.6°F | Ht 65.5 in | Wt 157.8 lb

## 2023-08-17 DIAGNOSIS — N951 Menopausal and female climacteric states: Secondary | ICD-10-CM

## 2023-08-17 DIAGNOSIS — D171 Benign lipomatous neoplasm of skin and subcutaneous tissue of trunk: Secondary | ICD-10-CM | POA: Diagnosis not present

## 2023-08-17 DIAGNOSIS — Z1231 Encounter for screening mammogram for malignant neoplasm of breast: Secondary | ICD-10-CM

## 2023-08-17 DIAGNOSIS — Z0001 Encounter for general adult medical examination with abnormal findings: Secondary | ICD-10-CM

## 2023-08-17 DIAGNOSIS — Z79890 Hormone replacement therapy (postmenopausal): Secondary | ICD-10-CM

## 2023-08-17 DIAGNOSIS — F900 Attention-deficit hyperactivity disorder, predominantly inattentive type: Secondary | ICD-10-CM

## 2023-08-17 DIAGNOSIS — F513 Sleepwalking [somnambulism]: Secondary | ICD-10-CM | POA: Insufficient documentation

## 2023-08-17 DIAGNOSIS — G4750 Parasomnia, unspecified: Secondary | ICD-10-CM

## 2023-08-17 LAB — CBC WITH DIFFERENTIAL/PLATELET
Basophils Absolute: 0 10*3/uL (ref 0.0–0.1)
Basophils Relative: 0.7 % (ref 0.0–3.0)
Eosinophils Absolute: 0.2 10*3/uL (ref 0.0–0.7)
Eosinophils Relative: 3.2 % (ref 0.0–5.0)
HCT: 44 % (ref 36.0–46.0)
Hemoglobin: 14.6 g/dL (ref 12.0–15.0)
Lymphocytes Relative: 31.1 % (ref 12.0–46.0)
Lymphs Abs: 1.6 10*3/uL (ref 0.7–4.0)
MCHC: 33.2 g/dL (ref 30.0–36.0)
MCV: 92.2 fL (ref 78.0–100.0)
Monocytes Absolute: 0.4 10*3/uL (ref 0.1–1.0)
Monocytes Relative: 8.5 % (ref 3.0–12.0)
Neutro Abs: 2.9 10*3/uL (ref 1.4–7.7)
Neutrophils Relative %: 56.5 % (ref 43.0–77.0)
Platelets: 271 10*3/uL (ref 150.0–400.0)
RBC: 4.77 Mil/uL (ref 3.87–5.11)
RDW: 13.2 % (ref 11.5–15.5)
WBC: 5.1 10*3/uL (ref 4.0–10.5)

## 2023-08-17 LAB — COMPREHENSIVE METABOLIC PANEL
ALT: 28 U/L (ref 0–35)
AST: 22 U/L (ref 0–37)
Albumin: 4.8 g/dL (ref 3.5–5.2)
Alkaline Phosphatase: 87 U/L (ref 39–117)
BUN: 13 mg/dL (ref 6–23)
CO2: 28 meq/L (ref 19–32)
Calcium: 10.3 mg/dL (ref 8.4–10.5)
Chloride: 99 meq/L (ref 96–112)
Creatinine, Ser: 0.84 mg/dL (ref 0.40–1.20)
GFR: 78.74 mL/min (ref >60.00)
Glucose, Bld: 103 mg/dL — ABNORMAL HIGH (ref 70–99)
Potassium: 4 meq/L (ref 3.5–5.1)
Sodium: 138 meq/L (ref 135–145)
Total Bilirubin: 0.6 mg/dL (ref 0.2–1.2)
Total Protein: 7.6 g/dL (ref 6.0–8.3)

## 2023-08-17 LAB — VITAMIN D 25 HYDROXY (VIT D DEFICIENCY, FRACTURES): VITD: 40.07 ng/mL (ref 30.00–100.00)

## 2023-08-17 LAB — TSH: TSH: 2.29 u[IU]/mL (ref 0.35–5.50)

## 2023-08-17 LAB — LIPID PANEL
Cholesterol: 199 mg/dL (ref 0–200)
HDL: 87.5 mg/dL (ref >39.00)
LDL Cholesterol: 103 mg/dL — ABNORMAL HIGH (ref 0–99)
NonHDL: 111.01
Total CHOL/HDL Ratio: 2
Triglycerides: 40 mg/dL (ref 0.0–149.0)
VLDL: 8 mg/dL (ref 0.0–40.0)

## 2023-08-17 MED ORDER — ESTRADIOL-NORETHINDRONE ACET 1-0.5 MG PO TABS: 1.0000 | ORAL_TABLET | Freq: Every day | ORAL | 3 refills | Status: DC

## 2023-08-17 NOTE — Patient Instructions (Signed)
Please return in 12 months for your annual complete physical; please come fasting.   I will release your lab results to you on your MyChart account with further instructions. You may see the results before I do, but when I review them I will send you a message with my report or have my assistant call you if things need to be discussed. Please reply to my message with any questions. Thank you!   Please call the office checked below to schedule your appointment for your mammogram and/or bone density screen (the checked studies were ordered): [x]   Mammogram  []   Bone Density  [x]   [facility]     [address] [place], [place]        [phone]         []   [facility]  [address] [place], [place]  [phone]   If you have any questions or concerns, please don't hesitate to send me a message via MyChart or call the office at [phone]. Thank you for visiting with [place] today! It's our pleasure caring for you.

## 2023-08-17 NOTE — Progress Notes (Signed)
Subjective  Chief Complaint  Patient presents with   Annual Exam    Fasting.    HPI: Terri Clarke is a 52 y.o. female who presents to [facility] [place] today for a Female Wellness Visit. She also has the concerns and/or needs as listed above in the chief complaint. These will be addressed in addition to the Health Maintenance Visit.   Wellness Visit: annual visit with health maintenance review and exam  HM: overdue for mammo. Other screens current and normal. Declines flu vaccine.  Chronic disease f/u and/or acute problem visit: (deemed necessary to be done in addition to the wellness visit): ADD: No longer taking ADD medications.  Getting by just fine managing behaviorally. Has a contusion to her face, nose and right arm.  She describes a fall that she does not recall.  She had a very long day on Friday.  Came home at 1145, took a shower then went to bed.  She slept till noon which was very unusual.  Her partner asked what happened when she awoke, apparently there was blood on the staircase, and her nose was bruised with a contusion and abrasion.  Patient does not remember getting up.  She notes she usually gets up to let the dogs out between 4 and 6.  She has a recollection that maybe she went to do that.  She thinks she fell on the staircase.  She cleaned up most of the blood.  She did go back to sleep.  This is the second time she has had a sleepwalking occurrence where she has fallen and injured herself.  She tore her rotator cuff about 2 and half years ago.  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  She has no other sort of atypical sleeping reports.  She is quite concerned.  She has no recollection of the event. Hot flashes: She was on birth control pills to half years ago.  Continues to struggle with multiple nightly awakenings.  She has postmenopausal  Assessment  1. Encounter for well adult exam with abnormal findings   2. Attention deficit hyperactivity disorder  (ADHD), predominantly inattentive type   3. Lipoma of torso   4. Screening mammogram for breast cancer   5. Menopausal hot flushes   6. Hormone replacement therapy (HRT)   7. Sleep walking disorder   8. Parasomnia, unspecified type      Plan  Female Wellness Visit: Age appropriate Health Maintenance and Prevention measures were discussed with patient. Included topics are cancer screening recommendations, ways to keep healthy (see AVS) including dietary and exercise recommendations, regular eye and dental care, use of seat belts, and avoidance of moderate alcohol use and tobacco use.  Patient will schedule her mammogram. BMI: discussed patient's BMI and encouraged positive lifestyle modifications to help get to or maintain a target BMI. HM needs and immunizations were addressed and ordered. See below for orders. See HM and immunization section for updates.  Declines flu vaccine Routine labs and screening tests ordered including cmp, cbc and lipids where appropriate. Discussed recommendations regarding Vit D and calcium supplementation (see AVS)  Chronic disease management visit and/or acute problem visit: Sleepwalking with injury x 2, needed to refer to neurology for further evaluation and possibly video polysomnography.  Discussed safety strategies for now.  Avoid sleep deprivation being overtired. Hot flushes which do interfere with sleep: Restart HRT.  Activella ordered ADD: Managed behaviorally  Follow up: 12 months for complete physical Orders Placed This Encounter  Procedures   MM DIGITAL SCREENING BILATERAL   VITAMIN D 25 Hydroxy (Vit-D Deficiency, Fractures)   CBC with Differential/Platelet   Comprehensive metabolic panel   Lipid panel   TSH   Ambulatory referral to Neurology   Meds ordered this encounter  Medications   estradiol-norethindrone (ACTIVELLA) 1-0.5 MG tablet    Sig: Take 1 tablet by mouth daily.    Dispense:  90 tablet    Refill:  3      Body mass index  is 25.86 kg/m. 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 Will try to change in the next several years.     ADHD (attention deficit hyperactivity disorder) 01/31/2019    History of cold sores 07/11/2018    Health Maintenance  Topic Date Due   MAMMOGRAM  11/10/2022   COVID-19 Vaccine (4 - 2023-24 season) 09/02/2023 (Originally 07/03/2023)   INFLUENZA VACCINE  01/30/2024 (Originally 06/02/2023)   Cervical Cancer Screening (HPV/Pap Cotest)  10/05/2026   DTaP/Tdap/Td (3 - Td or Tdap) 10/21/2029   Colonoscopy  11/24/2031   Hepatitis C Screening  Completed   HIV Screening  Completed   Zoster Vaccines- Shingrix  Completed   HPV VACCINES  Aged Out   Immunization History  Administered Date(s) Administered   Influenza,inj,Quad PF,6+ Mos 10/02/2019   PFIZER(Purple Top)SARS-COV-2 Vaccination 12/29/2019, 01/19/2020, 11/05/2020   Tdap 11/01/2008, 10/22/2019   Zoster Recombinant(Shingrix) 05/25/2021, 07/23/2021   We updated and reviewed the patient's past history in detail and it is documented below. Allergies: Patient has No Known Allergies. Past Medical History Patient  has a past medical history of ADD (attention deficit disorder) and Oral contraceptive use (10/02/2019). Past Surgical History Patient  has a past surgical history that includes Wisdom tooth extraction. Family History: Patient family history includes COPD in her mother; Colon polyps in her father; Diabetes  in her maternal grandmother; Healthy in her brother and sister; Hypertension in her sister; Lung cancer in her father; Migraines in her mother; Stroke in her father. Social History:  Patient  reports that she has never smoked. She has never used smokeless tobacco. She reports current alcohol use. She reports that she does not use drugs.  Review of Systems: Constitutional: negative for fever or malaise Ophthalmic: negative for photophobia, double vision or loss of vision Cardiovascular: negative for chest pain, dyspnea on exertion, or new LE swelling Respiratory: negative for SOB or persistent cough Gastrointestinal: negative for abdominal pain, change in bowel habits or melena Genitourinary: negative for dysuria or gross hematuria, no abnormal uterine bleeding or disharge Musculoskeletal: negative for new gait disturbance or muscular weakness Integumentary: negative for new or persistent rashes, no breast lumps Neurological: negative for TIA or stroke symptoms Psychiatric: negative for SI or delusions Allergic/Immunologic: negative for hives  Patient Care Team    Relationship Specialty Notifications Start End  Willow Ora, MD PCP - General Family Medicine  10/02/19  Case, [place], MD Consulting Physician Orthopedic Surgery  07/01/23   Armbruster, Willaim Rayas, MD Consulting Physician Gastroenterology  07/01/23     Objective  Vitals: BP 110/80   Pulse (!) 55   Temp 97.6 F (36.4 C)   Ht 5' 5.5" (1.664 m)   Wt 157 lb 12.8 oz (71.6 kg)   SpO2 97%   BMI 25.86 kg/m  General:  Well developed, well nourished, no acute distress  Psych:  Alert and orientedx3,normal mood and affect HEENT:  Normocephalic, atraumatic, non-icteric sclera,  supple neck without adenopathy, mass or thyromegaly nose with ecchymosis at the bridge, slightly crooked and healing abrasion Cardiovascular:  Normal S1, S2, RRR without gallop, rub or murmur Respiratory:  Good breath sounds bilaterally, CTAB with normal  respiratory effort Gastrointestinal: normal bowel sounds, soft, non-tender, no noted masses. No HSM MSK: extremities without edema, joints without erythema or swelling, right upper arm with ecchymosis Neurologic:    Mental status is normal.  Gross motor and sensory exams are normal.  No tremor  Commons side effects, risks, benefits, and alternatives for medications and treatment plan prescribed today were discussed, and the patient expressed understanding of the given instructions. Patient is instructed to call or message via MyChart if he/she has any questions or concerns regarding our treatment plan. No barriers to understanding were identified. We discussed Red Flag symptoms and signs in detail. Patient expressed understanding regarding what to do in case of urgent or emergency type symptoms.  Medication list was reconciled, printed and provided to the patient in AVS. Patient instructions and summary information was reviewed with the patient as documented in the AVS. This note was prepared with assistance of Dragon voice recognition software. Occasional wrong-word or sound-a-like substitutions may have occurred due to the inherent limitations of voice recognition software

## 2023-08-18 NOTE — Progress Notes (Signed)
Labs reviewed.  The 10-year ASCVD risk score (Arnett DK, et al., 2019) is: 0.8%   Values used to calculate the score:     Age: 52 years     Sex: Female     Is Non-Hispanic African American: No     Diabetic: No     Tobacco smoker: No     Systolic Blood Pressure: 110 mmHg     Is BP treated: No     HDL Cholesterol: 87.5 mg/dL     Total Cholesterol: 199 mg/dL

## 2023-09-16 ENCOUNTER — Ambulatory Visit
Admission: RE | Admit: 2023-09-16 | Discharge: 2023-09-16 | Disposition: A | Discharge status: Home / Self Care | Payer: BC Managed Care – PPO | Source: Ambulatory Visit | Attending: Family Medicine | Admitting: Family Medicine

## 2023-09-16 ENCOUNTER — Other Ambulatory Visit: Payer: Self-pay | Admitting: Family Medicine

## 2023-09-16 DIAGNOSIS — Z1231 Encounter for screening mammogram for malignant neoplasm of breast: Secondary | ICD-10-CM

## 2023-09-16 DIAGNOSIS — Z79890 Hormone replacement therapy (postmenopausal): Secondary | ICD-10-CM

## 2023-09-16 DIAGNOSIS — G4750 Parasomnia, unspecified: Secondary | ICD-10-CM

## 2023-09-16 DIAGNOSIS — N951 Menopausal and female climacteric states: Secondary | ICD-10-CM

## 2023-09-16 DIAGNOSIS — F513 Sleepwalking [somnambulism]: Secondary | ICD-10-CM

## 2023-09-16 DIAGNOSIS — Z0001 Encounter for general adult medical examination with abnormal findings: Secondary | ICD-10-CM

## 2023-09-16 DIAGNOSIS — D171 Benign lipomatous neoplasm of skin and subcutaneous tissue of trunk: Secondary | ICD-10-CM

## 2023-09-16 DIAGNOSIS — F900 Attention-deficit hyperactivity disorder, predominantly inattentive type: Secondary | ICD-10-CM

## 2023-09-22 ENCOUNTER — Institutional Professional Consult (permissible substitution): Payer: BC Managed Care – PPO | Admitting: Neurology

## 2023-10-04 ENCOUNTER — Institutional Professional Consult (permissible substitution): Payer: BC Managed Care – PPO | Admitting: Neurology

## 2023-10-04 ENCOUNTER — Encounter: Payer: Self-pay | Admitting: Neurology

## 2024-07-10 ENCOUNTER — Other Ambulatory Visit: Payer: Self-pay | Admitting: Family Medicine

## 2024-08-02 ENCOUNTER — Ambulatory Visit: Payer: Self-pay

## 2024-08-02 NOTE — Telephone Encounter (Signed)
 FYI Only or Action Required?: Action required by provider: Would like to be seen early this afternoon if possible. Appt scheduled for tomorrow afternoon.  Patient was last seen in primary care on 08/17/2023 by Jodie Lavern CROME, MD.  Called Nurse Triage reporting Hearing Problem. - Muffled hearing after leaving a newly renovated room last week. Also lump on hand  Symptoms began a week ago.  Interventions attempted: Nothing.  Symptoms are: unchanged.  Triage Disposition: See HCP Within 4 Hours (Or PCP Triage)  Patient/caregiver understands and will follow disposition?: Yes                    Copied from CRM #[id number]. Topic: Clinical - Red Word Triage >> Aug 02, 2024 11:13 AM Rea ORN wrote: Red Word that prompted transfer to Nurse Triage: pt has lump on hand near middle finger. Pt has muffling in both ears Reason for Disposition  [1] Hearing loss in one or both ears AND [2] sudden onset AND [3] present now  Answer Assessment - Initial Assessment Questions 1. DESCRIPTION: What type of hearing problem are you having? Describe it for me. (e.g., complete hearing loss, partial loss)     Muffled hearing - head swimmy 2. LOCATION: One or both ears? If one, ask: Which ear?     Both ears 3. SEVERITY: Can you hear anything? If Yes, ask: What can you hear? (e.g., ticking watch, whisper, talking)     Decreased hearing 4. ONSET: When did this begin? Did it start suddenly or come on gradually?     1 week ago 5. PATTERN: Does this come and go, or has it been constant since it started?     constant 6. PAIN: Is there any pain in your ear(s)?  (Scale 0-10; or none, mild, moderate, severe)     no 7. CAUSE: What do you think is causing this hearing problem?     Unsure - started after  8. OTHER SYMPTOMS: Do you have any other symptoms? (e.g., dizziness, ringing in ears)     no  Protocols used: Hearing Loss or Change-A-AH

## 2024-08-02 NOTE — Telephone Encounter (Signed)
 Reviewed

## 2024-08-03 ENCOUNTER — Ambulatory Visit: Payer: Self-pay | Admitting: Family Medicine

## 2024-08-03 VITALS — BP 110/70 | HR 65 | Temp 97.7°F | Ht 65.5 in | Wt 149.6 lb

## 2024-08-03 DIAGNOSIS — H6993 Unspecified Eustachian tube disorder, bilateral: Secondary | ICD-10-CM

## 2024-08-03 MED ORDER — FLUTICASONE PROPIONATE 50 MCG/ACT NA SUSP: 1.0000 | Freq: Every day | NASAL | 6 refills | Status: AC

## 2024-08-03 NOTE — Patient Instructions (Signed)
 Please follow up as scheduled for your next visit with me: 08/17/2024   If you have any questions or concerns, please don't hesitate to send me a message via MyChart or call the office at [phone]. Thank you for visiting with us  today! It's our pleasure caring for you.   Eustachian Tube Dysfunction  Eustachian tube dysfunction refers to a condition in which a blockage develops in the narrow passage that connects the middle ear to the back of the nose (eustachian tube). The eustachian tube regulates air pressure in the middle ear by letting air move between the ear and nose. It also helps to drain fluid from the middle ear space. Eustachian tube dysfunction can affect one or both ears. When the eustachian tube does not function properly, air pressure, fluid, or both can build up in the middle ear. What are the causes? This condition occurs when the eustachian tube becomes blocked or cannot open normally. Common causes of this condition include: Ear infections. Colds and other infections that affect the nose, mouth, and throat (upper respiratory tract). Allergies. Irritation from cigarette smoke. Irritation from stomach acid coming up into the esophagus (gastroesophageal reflux). The esophagus is the part of the body that moves food from the mouth to the stomach. Sudden changes in air pressure, such as from descending in an airplane or scuba diving. Abnormal growths in the nose or throat, such as: Growths that line the nose (nasal polyps). Abnormal growth of cells (tumors). Enlarged tissue at the back of the throat (adenoids). What increases the risk? You are more likely to develop this condition if: You smoke. You are overweight. You are a child who has: Certain birth defects of the mouth, such as cleft palate. Large tonsils or adenoids. What are the signs or symptoms? Common symptoms of this condition include: A feeling of fullness in the ear. Ear pain. Clicking or popping noises  in the ear. Ringing in the ear (tinnitus). Hearing loss. Loss of balance. Dizziness. Symptoms may get worse when the air pressure around you changes, such as when you travel to an area of high elevation, fly on an airplane, or go scuba diving. How is this diagnosed? This condition may be diagnosed based on: Your symptoms. A physical exam of your ears, nose, and throat. Tests, such as those that measure: The movement of your eardrum. Your hearing (audiometry). How is this treated? Treatment depends on the cause and severity of your condition. In mild cases, you may relieve your symptoms by moving air into your ears. This is called popping the ears. In more severe cases, or if you have symptoms of fluid in your ears, treatment may include: Medicines to relieve congestion (decongestants). Medicines that treat allergies (antihistamines). Nasal sprays or ear drops that contain medicines that reduce swelling (steroids). A procedure to drain the fluid in your eardrum. In this procedure, a small tube may be placed in the eardrum to: Drain the fluid. Restore the air in the middle ear space. A procedure to insert a balloon device through the nose to inflate the opening of the eustachian tube (balloon dilation). Follow these instructions at home: Lifestyle Do not do any of the following until your health care provider approves: Travel to high altitudes. Fly in airplanes. Work in a Estate agent or room. Scuba dive. Do not use any products that contain nicotine or tobacco. These products include cigarettes, chewing tobacco, and vaping devices, such as e-cigarettes. If you need help quitting, ask your health care provider. Keep your  ears dry. Wear fitted earplugs during showering and bathing. Dry your ears completely after. General instructions Take over-the-counter and prescription medicines only as told by your health care provider. Use techniques to help pop your ears as recommended by  your health care provider. These may include: Chewing gum. Yawning. Frequent, forceful swallowing. Closing your mouth, holding your nose closed, and gently blowing as if you are trying to blow air out of your nose. Keep all follow-up visits. This is important. Contact a health care provider if: Your symptoms do not go away after treatment. Your symptoms come back after treatment. You are unable to pop your ears. You have: A fever. Pain in your ear. Pain in your head or neck. Fluid draining from your ear. Your hearing suddenly changes. You become very dizzy. You lose your balance. Get help right away if: You have a sudden, severe increase in any of your symptoms. Summary Eustachian tube dysfunction refers to a condition in which a blockage develops in the eustachian tube. It can be caused by ear infections, allergies, inhaled irritants, or abnormal growths in the nose or throat. Symptoms may include ear pain or fullness, hearing loss, or ringing in the ears. Mild cases are treated with techniques to unblock the ears, such as yawning or chewing gum. More severe cases are treated with medicines or procedures. This information is not intended to replace advice given to you by your health care provider. Make sure you discuss any questions you have with your health care provider. Document Revised: 12/29/2020 Document Reviewed: 12/29/2020 Elsevier Patient Education  2024 [employer].

## 2024-08-03 NOTE — Progress Notes (Signed)
 Subjective  CC:  Chief Complaint  Patient presents with   hearing problems    Problem has been going on since last Friday. Pressure in ears and can not tell if if one or the other.    HPI: Terri Clarke is a 53 y.o. female who presents to the office today to address the problems listed above in the chief complaint. Discussed the use of AI scribe software for clinical note transcription with the patient, who gave verbal consent to proceed.  History of Present Illness Terri Clarke is a 53 year old female who presents with a sensation of pressure in her ears.  Aural pressure sensation - Sensation of pressure in both ears since last Friday after entering a newly renovated, pressurized-feeling room at work - Pressure sensation began immediately upon exiting the room and has persisted since onset - Describes sensation as 'not right' and similar to 'something swimming in my ear' - No ear pain, congestion, or allergy symptoms - Slight subjective decrease in hearing    Assessment  1. ETD (Eustachian tube dysfunction), bilateral      Plan  Assessment and Plan Assessment & Plan Eustachian tube dysfunction with bilateral middle ear effusion Bilateral ear pressure sensation following exposure to a pressurized room. No pain, congestion, or allergy symptoms reported. Hearing test normal. Possible fluid in the inner ear, likely due to inflammation or allergies affecting the eustachian tube. Symptoms consistent with eustachian tube dysfunction. - Prescribe Flonase nasal spray to reduce inflammation in the sinuses. - Recommend over-the-counter Sudafed for additional relief. - Instruct on Valsalva maneuver to help clear eustachian tube. - Prescribe Zyrtec to be taken at night. - Advise follow-up in 2-3 weeks to assess symptom improvement. - Consider referral to ENT if symptoms persist.   Follow up: as scheduled No orders of the defined types were placed in this encounter.  Meds  ordered this encounter  Medications   fluticasone (FLONASE) 50 MCG/ACT nasal spray    Sig: Place 1 spray into both nostrils daily.    Dispense:  16 g    Refill:  6     I reviewed the patients updated PMH, FH, and SocHx.  Patient Active Problem List   Diagnosis Date Noted   Sleep walking disorder 08/17/2023   Lipoma of torso 07/01/2023   Arthrofibrosis of right shoulder 03/15/2022   Traumatic complete tear of right rotator cuff 12/31/2021   Menopausal hot flushes 10/02/2019   ADHD (attention deficit hyperactivity disorder) 01/31/2019   History of cold sores 07/11/2018   Current Meds  Medication Sig   Cyanocobalamin (VITAMIN B-12 PO) Take by mouth. TAKE 2 GUMMIES DAILY   estradiol -norethindrone  (ACTIVELLA) 1-0.5 MG tablet TAKE 1 TABLET BY MOUTH EVERY DAY   fluticasone (FLONASE) 50 MCG/ACT nasal spray Place 1 spray into both nostrils daily.   Multiple Vitamins-Minerals (WOMENS MULTIVITAMIN PO) Take by mouth.   Turmeric (QC TUMERIC COMPLEX PO) Take by mouth. TAKE 2 GUMMIES DAILY   Allergies: Patient has no known allergies. Family History: Patient family history includes COPD in her mother; Colon polyps in her father; Diabetes in her maternal grandmother; Healthy in her brother and sister; Hypertension in her sister; Lung cancer in her father; Migraines in her mother; Stroke in her father. Social History:  Patient  reports that she has never smoked. She has never used smokeless tobacco. She reports current alcohol use. She reports that she does not use drugs.  Review of Systems: Constitutional: Negative for fever malaise or anorexia Cardiovascular:  negative for chest pain Respiratory: negative for SOB or persistent cough Gastrointestinal: negative for abdominal pain  Objective  Vitals: BP 110/70   Pulse 65   Temp 97.7 F (36.5 C)   Ht 5' 5.5 (1.664 m)   Wt 149 lb 9.6 oz (67.9 kg)   SpO2 99%   BMI 24.52 kg/m  General: no acute distress , A&Ox3 HEENT: PEERL, conjunctiva  normal, neck is supple TM: serous effusions bilaterally. Nl landmarks  Hearing Screening   125Hz  250Hz  500Hz  1000Hz  2000Hz  3000Hz  4000Hz  5000Hz  6000Hz  8000Hz   Right ear Pass Pass Pass Pass Pass Pass Pass Pass Pass Pass  Left ear Pass Pass Pass Pass Pass Pass Pass Pass Pass Pass    Commons side effects, risks, benefits, and alternatives for medications and treatment plan prescribed today were discussed, and the patient expressed understanding of the given instructions. Patient is instructed to call or message via MyChart if he/she has any questions or concerns regarding our treatment plan. No barriers to understanding were identified. We discussed Red Flag symptoms and signs in detail. Patient expressed understanding regarding what to do in case of urgent or emergency type symptoms.  Medication list was reconciled, printed and provided to the patient in AVS. Patient instructions and summary information was reviewed with the patient as documented in the AVS. This note was prepared with assistance of Dragon voice recognition software. Occasional wrong-word or sound-a-like substitutions may have occurred due to the inherent limitations of voice recognition software

## 2024-08-17 ENCOUNTER — Ambulatory Visit (INDEPENDENT_AMBULATORY_CARE_PROVIDER_SITE_OTHER): Payer: BC Managed Care – PPO | Admitting: Family Medicine

## 2024-08-17 ENCOUNTER — Encounter: Payer: Self-pay | Admitting: Family Medicine

## 2024-08-17 VITALS — BP 108/70 | HR 55 | Temp 97.7°F | Ht 65.5 in | Wt 149.0 lb

## 2024-08-17 DIAGNOSIS — Z0000 Encounter for general adult medical examination without abnormal findings: Secondary | ICD-10-CM

## 2024-08-17 DIAGNOSIS — Z13220 Encounter for screening for lipoid disorders: Secondary | ICD-10-CM | POA: Diagnosis not present

## 2024-08-17 DIAGNOSIS — F900 Attention-deficit hyperactivity disorder, predominantly inattentive type: Secondary | ICD-10-CM

## 2024-08-17 DIAGNOSIS — F513 Sleepwalking [somnambulism]: Secondary | ICD-10-CM

## 2024-08-17 DIAGNOSIS — Z2821 Immunization not carried out because of patient refusal: Secondary | ICD-10-CM

## 2024-08-17 DIAGNOSIS — Z1231 Encounter for screening mammogram for malignant neoplasm of breast: Secondary | ICD-10-CM | POA: Diagnosis not present

## 2024-08-17 DIAGNOSIS — N951 Menopausal and female climacteric states: Secondary | ICD-10-CM | POA: Diagnosis not present

## 2024-08-17 DIAGNOSIS — Z79890 Hormone replacement therapy (postmenopausal): Secondary | ICD-10-CM | POA: Insufficient documentation

## 2024-08-17 LAB — CBC WITH DIFFERENTIAL/PLATELET
Basophils Absolute: 0 K/uL (ref 0.0–0.1)
Basophils Relative: 0.5 % (ref 0.0–3.0)
Eosinophils Absolute: 0.2 K/uL (ref 0.0–0.7)
Eosinophils Relative: 3.9 % (ref 0.0–5.0)
HCT: 39.5 % (ref 36.0–46.0)
Hemoglobin: 13.4 g/dL (ref 12.0–15.0)
Lymphocytes Relative: 36.7 % (ref 12.0–46.0)
Lymphs Abs: 1.5 K/uL (ref 0.7–4.0)
MCHC: 34 g/dL (ref 30.0–36.0)
MCV: 89.5 fl (ref 78.0–100.0)
Monocytes Absolute: 0.4 K/uL (ref 0.1–1.0)
Monocytes Relative: 10 % (ref 3.0–12.0)
Neutro Abs: 2 K/uL (ref 1.4–7.7)
Neutrophils Relative %: 48.9 % (ref 43.0–77.0)
Platelets: 260 K/uL (ref 150.0–400.0)
RBC: 4.42 Mil/uL (ref 3.87–5.11)
RDW: 12.8 % (ref 11.5–15.5)
WBC: 4.2 K/uL (ref 4.0–10.5)

## 2024-08-17 LAB — COMPREHENSIVE METABOLIC PANEL WITH GFR
ALT: 14 U/L (ref 0–35)
AST: 15 U/L (ref 0–37)
Albumin: 4.6 g/dL (ref 3.5–5.2)
Alkaline Phosphatase: 50 U/L (ref 39–117)
BUN: 14 mg/dL (ref 6–23)
CO2: 29 meq/L (ref 19–32)
Calcium: 9.4 mg/dL (ref 8.4–10.5)
Chloride: 103 meq/L (ref 96–112)
Creatinine, Ser: 0.94 mg/dL (ref 0.40–1.20)
GFR: 68.32 mL/min (ref >60.00)
Glucose, Bld: 90 mg/dL (ref 70–99)
Potassium: 4.3 meq/L (ref 3.5–5.1)
Sodium: 140 meq/L (ref 135–145)
Total Bilirubin: 0.6 mg/dL (ref 0.2–1.2)
Total Protein: 7.1 g/dL (ref 6.0–8.3)

## 2024-08-17 LAB — LIPID PANEL
Cholesterol: 175 mg/dL (ref 0–200)
HDL: 91.6 mg/dL (ref >39.00)
LDL Cholesterol: 74 mg/dL (ref 0–99)
NonHDL: 83.16
Total CHOL/HDL Ratio: 2
Triglycerides: 47 mg/dL (ref 0.0–149.0)
VLDL: 9.4 mg/dL (ref 0.0–40.0)

## 2024-08-17 LAB — TSH: TSH: 2.73 u[IU]/mL (ref 0.35–5.50)

## 2024-08-17 MED ORDER — ESTRADIOL 1 MG PO TABS: 1.0000 mg | ORAL_TABLET | Freq: Every day | ORAL | 3 refills | Status: AC

## 2024-08-17 MED ORDER — PROGESTERONE MICRONIZED 100 MG PO CAPS: 100.0000 mg | ORAL_CAPSULE | Freq: Every day | ORAL | 3 refills | Status: AC

## 2024-08-17 NOTE — Patient Instructions (Signed)
 Please return in 12 months for your annual complete physical; please come fasting.   I will release your lab results to you on your MyChart account with further instructions. You may see the results before I do, but when I review them I will send you a message with my report or have my assistant call you if things need to be discussed. Please reply to my message with any questions. Thank you!   If you have any questions or concerns, please don't hesitate to send me a message via MyChart or call the office at [phone]. Thank you for visiting with us  today! It's our pleasure caring for you.    VISIT SUMMARY: Today, we discussed your sleep disturbances and menopausal symptoms. We also reviewed your overall health and wellness, including necessary screenings and vaccinations.  YOUR PLAN: -SLEEP DISTURBANCE: Your difficulty sleeping may be related to recent life changes, including your new job and early morning routine. We will monitor your sleep and make adjustments as needed.  -MENOPAUSAL SYMPTOMS: Menopausal symptoms can include hot flashes and sleep disturbances. We are changing your hormone therapy to oral estrogen in the morning and oral progesterone at night to see if this helps with your symptoms. Please monitor your response and let us  know if it is not effective.  -ADULT WELLNESS VISIT: You are overall healthy. We discussed the importance of regular screenings and vaccinations. Your mammogram is due, and we will order blood work to check your overall health. We will discuss the pneumonia vaccine at a later date.  INSTRUCTIONS: Please schedule your mammogram using the phone number provided. Continue taking your new hormone therapy as prescribed and monitor your symptoms. Follow up with us  if you experience any issues or if the new regimen is not effective.                      Contains text generated by [employer]bridge.                                  Contains text generated by Abridge.

## 2024-08-17 NOTE — Progress Notes (Signed)
 Subjective  Chief Complaint  Patient presents with   Annual Exam    Pt here for Annual Exam and is currently fasting     HPI: Terri Clarke is a 53 y.o. female who presents to [facility] [place] today for a Female Wellness Visit. She also has the concerns and/or needs as listed above in the chief complaint. These will be addressed in addition to the Health Maintenance Visit.   Wellness Visit: annual visit with health maintenance review and exam  HM: Mammogram due in November at the breast center.  Patient to schedule.  Pap smear and colonoscopy up-to-date.  Both normal.  Healthy lifestyle.  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  Commuting to [place].  Liking it.  Declines Prevnar 20 and flu vaccinations today.  Education given  Chronic disease f/u and/or acute problem visit: (deemed necessary to be done in addition to the wellness visit): Discussed the use of AI scribe software for clinical note transcription with the patient, who gave verbal consent to proceed.  History of Present Illness Terri Clarke is a 53 year old female who presents with sleep disturbances and hormonal management concerns.  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 - Significant adjustment to new job, including a lengthy commute - Wakes at 4 AM to feed dogs, which may contribute to sleep disturbance - No ongoing issues with parasomnia  Menopausal symptoms and hormonal therapy - Currently taking a combined hormonal pill for management of hot flashes, which is effective for vasomotor symptoms but not for sleep disturbance - Previously trialed an estrogen patch, which was ineffective,  possibly due to absorption issues  Fall risk and safety - Recent fall down the stairs attributed to waking early and not being fully alert - Now uses the handrail when descending stairs to prevent further incidents    Assessment  1. Annual physical exam   2. Menopausal hot flushes   3. Postmenopausal HRT (hormone replacement therapy)   4. Screening mammogram for breast cancer   5. Pneumococcal vaccination declined      Plan  Female Wellness Visit: Age appropriate Health Maintenance and Prevention measures were discussed with patient. Included topics are cancer screening recommendations, ways to keep healthy (see AVS) including dietary and exercise recommendations, regular eye and dental care, use of seat belts, and avoidance of moderate alcohol use and tobacco use.  Patient to schedule mammogram BMI: discussed patient's BMI and encouraged positive lifestyle modifications to help get to or maintain a target BMI. HM needs and immunizations were addressed and ordered. See below for orders. See HM and immunization section for updates. Routine labs and screening tests ordered including cmp, cbc and lipids where appropriate. Discussed recommendations regarding Vit D and calcium supplementation (see AVS)  Chronic disease management visit and/or acute problem visit: Assessment and Plan Assessment & Plan Menopausal symptoms Experiencing menopausal symptoms, including sleep disturbances. Currently on a combined hormonal pill. Previous issues with patch absorption noted. - Prescribe oral estrogen and oral progesterone - Instruct to take estrogen in the morning and progesterone at night to improve sleep - Monitor response to new hormone regimen and revert to previous regimen if ineffective  Adult Wellness Visit Overall healthy with no  significant issues reported. Mammogram is due, and colonoscopy is up to date. Discussed immunizations, including COVID-19, shingles, and pneumonia vaccines. -  Order mammogram and blood work - Provide phone number for mammogram scheduling in after visit summary - Discuss pneumonia vaccination at a later date    Follow up: 1 year for complete physical Orders Placed This Encounter  Procedures   MM DIGITAL SCREENING BILATERAL   CBC with Differential/Platelet   Comprehensive metabolic panel with GFR   Lipid panel   TSH   Meds ordered this encounter  Medications   estradiol  (ESTRACE ) 1 MG tablet    Sig: Take 1 tablet (1 mg total) by mouth daily.    Dispense:  90 tablet    Refill:  3   progesterone (PROMETRIUM) 100 MG capsule    Sig: Take 1 capsule (100 mg total) by mouth daily.    Dispense:  90 capsule    Refill:  3      Body mass index is 24.42 kg/m. 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 Will try to change in the next several years.     ADHD (attention deficit hyperactivity disorder) 01/31/2019   History of cold sores 07/11/2018   Health Maintenance  Topic Date Due   Hepatitis B Vaccines 19-59 Average Risk (1 of 3 - 19+ 3-dose series) Never done   Mammogram  09/15/2024   COVID-19 Vaccine (4 - 2025-26 season) 08/19/2024 (Originally 07/02/2024)   Influenza Vaccine  01/29/2025 (Originally 06/01/2024)   Pneumococcal Vaccine: 50+ Years (1 of 1 - PCV) 08/17/2025 (Originally 02/19/2019)   Cervical Cancer Screening (HPV/Pap Cotest)  10/05/2026   DTaP/Tdap/Td (3 - Td  or Tdap) 10/21/2029   Colonoscopy  11/24/2031   Hepatitis C Screening  Completed   HIV Screening  Completed   Zoster Vaccines- Shingrix  Completed   HPV VACCINES  Aged Out   Meningococcal B Vaccine  Aged Out   Immunization History  Administered Date(s) Administered   Influenza,inj,Quad PF,6+ Mos 10/02/2019   PFIZER(Purple Top)SARS-COV-2 Vaccination 12/29/2019, 01/19/2020, 11/05/2020   Tdap 11/01/2008, 10/22/2019   Zoster Recombinant(Shingrix) 05/25/2021, 07/23/2021   We updated and reviewed the patient's past history in detail and it is documented below. Allergies: Patient has no known allergies. Past Medical History Patient  has a past medical history of ADD (attention deficit disorder) and Oral contraceptive use (10/02/2019). Past Surgical History Patient  has a past surgical history that includes Wisdom tooth extraction. Family History: Patient family history includes COPD in her mother; Colon polyps in her father; Diabetes in her maternal grandmother; Healthy in her brother and sister; Hypertension in her sister; Lung cancer in her father; Migraines in her mother; Stroke in her father. Social History:  Patient  reports that she has never smoked. She has never used smokeless tobacco. She reports current alcohol use. She reports that she does not use drugs.  Review of Systems: Constitutional: negative for fever or malaise Ophthalmic: negative for photophobia, double vision or loss of vision Cardiovascular: negative for chest pain, dyspnea on exertion, or new LE swelling Respiratory: negative for SOB or persistent cough Gastrointestinal: negative for abdominal pain, change in bowel habits or melena Genitourinary: negative for dysuria or gross hematuria, no abnormal uterine bleeding or disharge Musculoskeletal: negative for new gait disturbance or muscular weakness Integumentary: negative for new or persistent rashes, no breast lumps Neurological: negative for TIA or stroke  symptoms Psychiatric: negative for SI or delusions Allergic/Immunologic: negative for hives  Patient Care Team    Relationship Specialty Notifications Start End  Jodie Lavern CROME, MD PCP - General Family Medicine  10/02/19   Case, [place], MD Consulting Physician Orthopedic Surgery  07/01/23   Armbruster, Elspeth SQUIBB, MD Consulting Physician Gastroenterology  07/01/23     Objective  Vitals: BP 108/70   Pulse (!) 55   Temp 97.7 F (36.5 C)   Ht 5' 5.5 (1.664 m)   Wt 149 lb (67.6 kg)   SpO2 99%   BMI 24.42 kg/m  General:  Well developed, well nourished, no acute distress  Psych:  Alert and orientedx3,normal mood and affect HEENT:  Normocephalic, atraumatic, non-icteric sclera,  supple neck without adenopathy, mass or thyromegaly Cardiovascular:  Normal S1, S2, RRR without gallop, rub or murmur Respiratory:  Good breath sounds bilaterally, CTAB with normal respiratory effort Gastrointestinal: normal bowel sounds, soft, non-tender, no noted masses. No HSM MSK: extremities without edema, joints without erythema or swelling Neurologic:    Mental status is normal.  Gross motor and sensory exams are normal.  No tremor  Commons side effects, risks, benefits, and alternatives for medications and treatment plan prescribed today were discussed, and the patient expressed understanding of the given instructions. Patient is instructed to call or message via MyChart if he/she has any questions or concerns regarding our treatment plan. No barriers to understanding were identified. We discussed Red Flag symptoms and signs in detail. Patient expressed understanding regarding what to do in case of urgent or emergency type symptoms.  Medication list was reconciled, printed and provided to the patient in AVS. Patient instructions and summary information was reviewed with the patient as documented in the AVS. This note was prepared with assistance of Dragon voice recognition software. Occasional wrong-word or  sound-a-like substitutions may have occurred due to the inherent limitations of voice recognition software

## 2024-08-20 ENCOUNTER — Ambulatory Visit: Payer: Self-pay | Admitting: Family Medicine

## 2024-08-20 NOTE — Progress Notes (Signed)
 Labs reviewed.  The 10-year ASCVD risk score (Arnett DK, et al., 2019) is: 0.8%   Values used to calculate the score:     Age: 53 years     Clincally relevant sex: Female     Is Non-Hispanic African American: No     Diabetic: No     Tobacco smoker: No     Systolic Blood Pressure: 108 mmHg     Is BP treated: No     HDL Cholesterol: 91.6 mg/dL     Total Cholesterol: 175 mg/dL

## 2025-08-23 ENCOUNTER — Encounter: Admitting: Family Medicine
